# Patient Record
Sex: Male | Born: 1970 | Race: White | Hispanic: No | Marital: Married | Smoking: Former smoker
Health system: Southern US, Community
[De-identification: ages and names within clinical notes are randomized; demographics above are authoritative.]

## PROBLEM LIST (undated history)

## (undated) DIAGNOSIS — J3089 Other allergic rhinitis: Secondary | ICD-10-CM

## (undated) DIAGNOSIS — J302 Other seasonal allergic rhinitis: Secondary | ICD-10-CM

## (undated) HISTORY — DX: Other seasonal allergic rhinitis: J30.2

## (undated) HISTORY — PX: APPENDECTOMY: SHX54

## (undated) HISTORY — DX: Other seasonal allergic rhinitis: J30.89

## (undated) HISTORY — DX: Gilbert syndrome: E80.4

## (undated) HISTORY — PX: WISDOM TOOTH EXTRACTION: SHX21

---

## 2006-08-25 ENCOUNTER — Ambulatory Visit: Payer: Self-pay | Admitting: Internal Medicine

## 2006-09-01 ENCOUNTER — Ambulatory Visit: Payer: Self-pay | Admitting: Internal Medicine

## 2006-09-03 ENCOUNTER — Ambulatory Visit: Payer: Self-pay | Admitting: Internal Medicine

## 2008-02-27 ENCOUNTER — Ambulatory Visit: Payer: Self-pay | Admitting: Internal Medicine

## 2008-02-27 DIAGNOSIS — J302 Other seasonal allergic rhinitis: Secondary | ICD-10-CM

## 2008-03-02 ENCOUNTER — Ambulatory Visit: Payer: Self-pay | Admitting: Internal Medicine

## 2008-03-02 ENCOUNTER — Encounter (INDEPENDENT_AMBULATORY_CARE_PROVIDER_SITE_OTHER): Payer: Self-pay | Admitting: *Deleted

## 2008-03-02 LAB — CONVERTED CEMR LAB
OCCULT 2: NEGATIVE
OCCULT 3: NEGATIVE

## 2008-03-06 ENCOUNTER — Encounter (INDEPENDENT_AMBULATORY_CARE_PROVIDER_SITE_OTHER): Payer: Self-pay | Admitting: *Deleted

## 2011-08-05 ENCOUNTER — Emergency Department (HOSPITAL_COMMUNITY)
Admission: EM | Admit: 2011-08-05 | Discharge: 2011-08-05 | Disposition: A | Discharge status: Home / Self Care | Payer: Managed Care, Other (non HMO) | Attending: Emergency Medicine | Admitting: Emergency Medicine

## 2011-08-05 DIAGNOSIS — Z203 Contact with and (suspected) exposure to rabies: Secondary | ICD-10-CM | POA: Insufficient documentation

## 2011-08-12 ENCOUNTER — Inpatient Hospital Stay (INDEPENDENT_AMBULATORY_CARE_PROVIDER_SITE_OTHER)
Admission: RE | Admit: 2011-08-12 | Discharge: 2011-08-12 | Disposition: A | Discharge status: Home / Self Care | Payer: Managed Care, Other (non HMO) | Source: Ambulatory Visit | Attending: Emergency Medicine | Admitting: Emergency Medicine

## 2011-08-12 DIAGNOSIS — Z23 Encounter for immunization: Secondary | ICD-10-CM

## 2011-08-19 ENCOUNTER — Inpatient Hospital Stay (INDEPENDENT_AMBULATORY_CARE_PROVIDER_SITE_OTHER)
Admission: RE | Admit: 2011-08-19 | Discharge: 2011-08-19 | Disposition: A | Discharge status: Home / Self Care | Payer: Managed Care, Other (non HMO) | Source: Ambulatory Visit | Attending: Emergency Medicine | Admitting: Emergency Medicine

## 2011-08-19 DIAGNOSIS — Z23 Encounter for immunization: Secondary | ICD-10-CM

## 2013-01-02 ENCOUNTER — Encounter: Payer: Self-pay | Admitting: Internal Medicine

## 2013-01-02 ENCOUNTER — Ambulatory Visit (INDEPENDENT_AMBULATORY_CARE_PROVIDER_SITE_OTHER): Payer: Managed Care, Other (non HMO) | Admitting: Internal Medicine

## 2013-01-02 ENCOUNTER — Telehealth: Payer: Self-pay | Admitting: Internal Medicine

## 2013-01-02 VITALS — BP 132/84 | HR 98 | Temp 98.2°F | Resp 12 | Ht 70.5 in | Wt 214.0 lb

## 2013-01-02 DIAGNOSIS — E782 Mixed hyperlipidemia: Secondary | ICD-10-CM | POA: Insufficient documentation

## 2013-01-02 DIAGNOSIS — Z20828 Contact with and (suspected) exposure to other viral communicable diseases: Secondary | ICD-10-CM

## 2013-01-02 DIAGNOSIS — Z0000 Encounter for general adult medical examination without abnormal findings: Secondary | ICD-10-CM

## 2013-01-02 DIAGNOSIS — E785 Hyperlipidemia, unspecified: Secondary | ICD-10-CM | POA: Insufficient documentation

## 2013-01-02 MED ORDER — OSELTAMIVIR PHOSPHATE 75 MG PO CAPS: 75.0000 mg | ORAL_CAPSULE | Freq: Two times a day (BID) | ORAL | Status: DC

## 2013-01-02 NOTE — Patient Instructions (Addendum)
Preventive Health Care: Exercise at least 30-45 minutes a day,  3-4 days a week.  Eat a low-fat diet with lots of fruits and vegetables, up to 7-9 servings per day.  Consume less than 40 grams of sugar per day from foods & drinks with High Fructose Corn Sugar as #1,2,3 or # 4 on label. Please  schedule fasting Labs : BMET,Lipids, hepatic panel, CBC & dif, TSH. PLEASE BRING THESE INSTRUCTIONS TO FOLLOW UP  LAB APPOINTMENT.This will guarantee correct labs are drawn, eliminating need for repeat blood sampling ( needle sticks ! ). Diagnoses /Codes: V70.0.  If you activate My Chart; the results can be released to you as soon as they populate from the lab. If you choose not to use this program; the labs have to be reviewed, copied & mailed   causing a delay in getting the results to you.   NSAIDS ( Aleve, Advil, Naproxen) or Tylenol every 4 hrs as needed for fever as discussed based on label recommendations. Zicam Melts or Zinc lozenges ; vitamin C 2000 mg daily; & Echinacea for 4-7 days. Report fever, exudate("pus") or progressive forehead or facial pain.

## 2013-01-02 NOTE — Progress Notes (Signed)
  Subjective:    Patient ID: Travis Forbes, male    DOB: 07/24/1971, 42 y.o.   MRN: [id number]  HPI  Travis Forbes is here for a physical;acute issues include flu like syndrome.      Review of Systems He's been traveling as part of his job recently and was exposed to influenza. He describes a cough with some green sputum as well as arthralgias and myalgias. DayQuil was of some benefit.  He's had associated low-grade fever. He denies frontal headache, facial pain, or nasal purulence. He has not taken flu shot.    Objective:   Physical Exam Gen.: Healthy and well-nourished in appearance. Alert, appropriate and cooperative throughout exam.  Head: Normocephalic without obvious abnormalities Eyes: No corneal or conjunctival inflammation noted. Pupils equal round reactive to light and accommodation. Fundal exam is benign without hemorrhages, exudate, papilledema. Extraocular motion intact. Vision grossly normal. Ears: External  ear exam reveals no significant lesions or deformities. Canals clear .TMs normal. Hearing is grossly normal bilaterally. Nose: External nasal exam reveals no deformity or inflammation. Nasal mucosa are pink and moist. No lesions or exudates noted. Septum  To R Mouth: Oral mucosa and oropharynx reveal no lesions or exudates. Teeth in good repair.Hoarse Neck: No deformities, masses, or tenderness noted. Range of motion &Thyroid normal. Lungs: Normal respiratory effort; chest expands symmetrically. Lungs are clear to auscultation without rales, wheezes, or increased work of breathing. Heart: Normal rate and rhythm. Normal S1 and S2. No gallop, click, or rub. S4 w/o murmur. Abdomen: Bowel sounds normal; abdomen soft and nontender. No masses, organomegaly or hernias noted. 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 Prostate is normal without enlargement, asymmetry, nodularity, or induration.                                    Musculoskeletal/extremities: No deformity or  scoliosis noted of  the thoracic or lumbar spine. No clubbing, cyanosis, edema, or significant extremity  deformity noted. Range of motion normal .Tone & strength  normal.Joints normal. Nail health good. Able to lie down & sit up w/o help. Negative SLR bilaterally Vascular: Carotid, radial artery, dorsalis pedis and  posterior tibial pulses are full and equal. No bruits present. Neurologic: Alert and oriented x3. Deep tendon reflexes symmetrical and normal.  Skin: Intact without suspicious lesions or rashes. Lymph: No cervical, axillary, or inguinal lymphadenopathy present. Psych: Mood and affect are normal. Normally interactive                                                                                         Assessment & Plan:  #1 comprehensive physical exam; no acute findings #2 see Problem List with Assessments & Recommendations Plan: see Orders

## 2013-01-02 NOTE — Telephone Encounter (Signed)
Patient would like to have labs done at [facility] office.

## 2013-01-03 NOTE — Telephone Encounter (Signed)
Orders placed.

## 2014-04-03 ENCOUNTER — Encounter: Payer: Self-pay | Admitting: Internal Medicine

## 2014-04-03 ENCOUNTER — Telehealth: Payer: Self-pay

## 2014-04-03 ENCOUNTER — Other Ambulatory Visit (INDEPENDENT_AMBULATORY_CARE_PROVIDER_SITE_OTHER): Payer: Managed Care, Other (non HMO)

## 2014-04-03 ENCOUNTER — Ambulatory Visit (INDEPENDENT_AMBULATORY_CARE_PROVIDER_SITE_OTHER): Payer: Managed Care, Other (non HMO) | Admitting: Internal Medicine

## 2014-04-03 VITALS — BP 130/90 | HR 66 | Temp 97.7°F | Resp 12 | Ht 69.25 in | Wt 212.0 lb

## 2014-04-03 DIAGNOSIS — Z0000 Encounter for general adult medical examination without abnormal findings: Secondary | ICD-10-CM

## 2014-04-03 DIAGNOSIS — Z3009 Encounter for other general counseling and advice on contraception: Secondary | ICD-10-CM

## 2014-04-03 DIAGNOSIS — E785 Hyperlipidemia, unspecified: Secondary | ICD-10-CM

## 2014-04-03 LAB — BASIC METABOLIC PANEL
BUN: 10 mg/dL (ref 6–23)
CALCIUM: 9.4 mg/dL (ref 8.4–10.5)
CO2: 29 mEq/L (ref 19–32)
Chloride: 105 mEq/L (ref 96–112)
Creatinine, Ser: 0.9 mg/dL (ref 0.4–1.5)
GFR: 102.32 mL/min (ref >60.00)
Glucose, Bld: 91 mg/dL (ref 70–99)
Potassium: 4.3 mEq/L (ref 3.5–5.1)
SODIUM: 140 meq/L (ref 135–145)

## 2014-04-03 LAB — HEPATIC FUNCTION PANEL
ALBUMIN: 4.3 g/dL (ref 3.5–5.2)
ALK PHOS: 46 U/L (ref 39–117)
ALT: 32 U/L (ref 0–53)
AST: 23 U/L (ref 0–37)
Bilirubin, Direct: 0.2 mg/dL (ref 0.0–0.3)
TOTAL PROTEIN: 7.1 g/dL (ref 6.0–8.3)
Total Bilirubin: 1.5 mg/dL — ABNORMAL HIGH (ref 0.3–1.2)

## 2014-04-03 LAB — LIPID PANEL
Cholesterol: 181 mg/dL (ref 0–200)
HDL: 48.7 mg/dL (ref >39.00)
LDL CALC: 114 mg/dL — AB (ref 0–99)
TRIGLYCERIDES: 92 mg/dL (ref 0.0–149.0)
Total CHOL/HDL Ratio: 4
VLDL: 18.4 mg/dL (ref 0.0–40.0)

## 2014-04-03 LAB — CBC WITH DIFFERENTIAL/PLATELET
BASOS ABS: 0 10*3/uL (ref 0.0–0.1)
BASOS PCT: 0.6 % (ref 0.0–3.0)
Eosinophils Absolute: 0.2 10*3/uL (ref 0.0–0.7)
Eosinophils Relative: 2.8 % (ref 0.0–5.0)
HCT: 42.5 % (ref 39.0–52.0)
HEMOGLOBIN: 14.8 g/dL (ref 13.0–17.0)
LYMPHS PCT: 34.5 % (ref 12.0–46.0)
Lymphs Abs: 2.1 10*3/uL (ref 0.7–4.0)
MCHC: 34.7 g/dL (ref 30.0–36.0)
MCV: 90.3 fl (ref 78.0–100.0)
MONOS PCT: 7.6 % (ref 3.0–12.0)
Monocytes Absolute: 0.5 10*3/uL (ref 0.1–1.0)
NEUTROS ABS: 3.3 10*3/uL (ref 1.4–7.7)
Neutrophils Relative %: 54.5 % (ref 43.0–77.0)
Platelets: 221 10*3/uL (ref 150.0–400.0)
RBC: 4.7 Mil/uL (ref 4.22–5.81)
RDW: 12.4 % (ref 11.5–14.6)
WBC: 6.1 10*3/uL (ref 4.5–10.5)

## 2014-04-03 LAB — TSH: TSH: 1.51 u[IU]/mL (ref 0.35–5.50)

## 2014-04-03 NOTE — Progress Notes (Signed)
Pre visit review using our clinic review tool, if applicable. No additional management support is needed unless otherwise documented below in the visit note. 

## 2014-04-03 NOTE — Telephone Encounter (Signed)
Unfortunately I did know this;  these were completed @ 10:19 am . If you will sign up for MyChart ; results will be available this evening

## 2014-04-03 NOTE — Patient Instructions (Signed)

## 2014-04-03 NOTE — Progress Notes (Signed)
   Subjective:    Patient ID: Travis Forbes, male    DOB: 10-28-1971, 43 y.o.   MRN: [id number]  HPI  He is here for a physical;acute issues denied . No PMH of HTN. He is considering vasectomy.  A heart healthy diet is followed; exercise encompasses 30-90  Minutes 3-4 times per week as treadmill & walking without symptoms.  Family history is neg for premature coronary disease. Advanced cholesterol testing reveals  LDL goal is less than 110 ; ideally < 80 . To date no statin.  Low dose ASA not taken     Review of Systems Specifically denied are  chest pain, palpitations, dyspnea, or claudication.  Significant abdominal symptoms or myalgias not present.       Objective:   Physical Exam Gen.: Healthy and well-nourished in appearance. Alert, appropriate and cooperative throughout exam. Appears younger than stated age  Head: Normocephalic without obvious abnormalities  Eyes: No corneal or conjunctival inflammation noted. Pupils equal round reactive to light and accommodation. Extraocular motion intact.  Vision grossly normal w/o lenses Ears: External  ear exam reveals no significant lesions or deformities. Canals clear .TMs normal. Hearing is grossly normal bilaterally. Nose: External nasal exam reveals no deformity or inflammation. Nasal mucosa are pink and moist. No lesions or exudates noted.   Mouth: Oral mucosa and oropharynx reveal no lesions or exudates. Teeth in good repair. Neck: No deformities, masses, or tenderness noted. Range of motion &Thyroid normal. Lungs: Normal respiratory effort; chest expands symmetrically. Lungs are clear to auscultation without rales, wheezes, or increased work of breathing. Heart: Normal rate and rhythm. Normal S1 and S2. No gallop, click, or rub. S4 w/o murmur. Abdomen: Bowel sounds normal; abdomen soft and nontender. No masses, organomegaly or hernias noted. Genitalia: Genitalia normal except for left varices. Prostate is normal without enlargement,  asymmetry, nodularity, or induration                                Musculoskeletal/extremities: No deformity or scoliosis noted of  the thoracic or lumbar spine.  No clubbing, cyanosis, edema, or significant extremity  deformity noted. Range of motion normal .Tone & strength normal. Hand joints normal Fingernail  health good. Able to lie down & sit up w/o help. Negative SLR bilaterally Vascular: Carotid, radial artery, dorsalis pedis and  posterior tibial pulses are full and equal. No bruits present. Neurologic: Alert and oriented x3. Deep tendon reflexes symmetrical and normal.  Gait normal . Skin: Intact without suspicious lesions or rashes. Lymph: No cervical, axillary, or inguinal lymphadenopathy present. Psych: Mood and affect are normal. Normally interactive                                                                                        Assessment & Plan:  #1 comprehensive physical exam; no acute findings #2 high normal diastolic BP  Plan: see Orders  & Recommendations

## 2014-04-03 NOTE — Telephone Encounter (Signed)
Patient would like to have labs done at [facility]. So will you write the orders out on an rx sheet so I can fax it to [facility]. Thanks

## 2019-07-06 ENCOUNTER — Other Ambulatory Visit: Payer: Self-pay | Admitting: Internal Medicine

## 2019-07-06 DIAGNOSIS — Z20822 Contact with and (suspected) exposure to covid-19: Secondary | ICD-10-CM

## 2019-07-06 NOTE — Progress Notes (Signed)
no

## 2019-07-09 LAB — NOVEL CORONAVIRUS, NAA: SARS-CoV-2, NAA: NOT DETECTED

## 2019-10-02 ENCOUNTER — Other Ambulatory Visit: Payer: Self-pay

## 2019-10-02 DIAGNOSIS — Z20822 Contact with and (suspected) exposure to covid-19: Secondary | ICD-10-CM

## 2019-10-04 LAB — NOVEL CORONAVIRUS, NAA: SARS-CoV-2, NAA: NOT DETECTED

## 2021-01-31 ENCOUNTER — Ambulatory Visit (HOSPITAL_COMMUNITY)
Admission: EM | Admit: 2021-01-31 | Discharge: 2021-01-31 | Disposition: A | Discharge status: Home / Self Care | Payer: Managed Care, Other (non HMO) | Source: Home / Self Care

## 2021-01-31 ENCOUNTER — Ambulatory Visit (INDEPENDENT_AMBULATORY_CARE_PROVIDER_SITE_OTHER): Payer: Managed Care, Other (non HMO)

## 2021-01-31 ENCOUNTER — Other Ambulatory Visit: Payer: Self-pay

## 2021-01-31 ENCOUNTER — Encounter (HOSPITAL_COMMUNITY): Payer: Self-pay

## 2021-01-31 DIAGNOSIS — R1031 Right lower quadrant pain: Secondary | ICD-10-CM

## 2021-01-31 DIAGNOSIS — K651 Peritoneal abscess: Secondary | ICD-10-CM | POA: Diagnosis not present

## 2021-01-31 DIAGNOSIS — K3533 Acute appendicitis with perforation and localized peritonitis, with abscess: Secondary | ICD-10-CM | POA: Diagnosis not present

## 2021-01-31 DIAGNOSIS — R1030 Lower abdominal pain, unspecified: Secondary | ICD-10-CM | POA: Diagnosis not present

## 2021-01-31 DIAGNOSIS — R1032 Left lower quadrant pain: Secondary | ICD-10-CM

## 2021-01-31 DIAGNOSIS — R5383 Other fatigue: Secondary | ICD-10-CM | POA: Insufficient documentation

## 2021-01-31 DIAGNOSIS — K5900 Constipation, unspecified: Secondary | ICD-10-CM | POA: Diagnosis not present

## 2021-01-31 DIAGNOSIS — R21 Rash and other nonspecific skin eruption: Secondary | ICD-10-CM

## 2021-01-31 LAB — CBC WITH DIFFERENTIAL/PLATELET
Abs Immature Granulocytes: 0.3 10*3/uL — ABNORMAL HIGH (ref 0.00–0.07)
Basophils Absolute: 0 10*3/uL (ref 0.0–0.1)
Basophils Relative: 0 %
Eosinophils Absolute: 0 10*3/uL (ref 0.0–0.5)
Eosinophils Relative: 0 %
HCT: 40.2 % (ref 39.0–52.0)
Hemoglobin: 13.7 g/dL (ref 13.0–17.0)
Immature Granulocytes: 1 %
Lymphocytes Relative: 5 %
Lymphs Abs: 1.1 10*3/uL (ref 0.7–4.0)
MCH: 30.6 pg (ref 26.0–34.0)
MCHC: 34.1 g/dL (ref 30.0–36.0)
MCV: 89.9 fL (ref 80.0–100.0)
Monocytes Absolute: 1.8 10*3/uL — ABNORMAL HIGH (ref 0.1–1.0)
Monocytes Relative: 8 %
Neutro Abs: 19.1 10*3/uL — ABNORMAL HIGH (ref 1.7–7.7)
Neutrophils Relative %: 86 %
Platelets: 394 10*3/uL (ref 150–400)
RBC: 4.47 MIL/uL (ref 4.22–5.81)
RDW: 11.3 % — ABNORMAL LOW (ref 11.5–15.5)
WBC: 22.3 10*3/uL — ABNORMAL HIGH (ref 4.0–10.5)
nRBC: 0 % (ref 0.0–0.2)

## 2021-01-31 LAB — COMPREHENSIVE METABOLIC PANEL
ALT: 42 U/L (ref 0–44)
AST: 25 U/L (ref 15–41)
Albumin: 3.1 g/dL — ABNORMAL LOW (ref 3.5–5.0)
Alkaline Phosphatase: 73 U/L (ref 38–126)
Anion gap: 11 (ref 5–15)
BUN: 6 mg/dL (ref 6–20)
CO2: 24 mmol/L (ref 22–32)
Calcium: 9 mg/dL (ref 8.9–10.3)
Chloride: 98 mmol/L (ref 98–111)
Creatinine, Ser: 0.88 mg/dL (ref 0.61–1.24)
GFR, Estimated: >60 mL/min (ref >60)
Glucose, Bld: 108 mg/dL — ABNORMAL HIGH (ref 70–99)
Potassium: 3.8 mmol/L (ref 3.5–5.1)
Sodium: 133 mmol/L — ABNORMAL LOW (ref 135–145)
Total Bilirubin: 1.3 mg/dL — ABNORMAL HIGH (ref 0.3–1.2)
Total Protein: 7.2 g/dL (ref 6.5–8.1)

## 2021-01-31 LAB — LIPASE, BLOOD: Lipase: 30 U/L (ref 11–51)

## 2021-01-31 MED ORDER — DICYCLOMINE HCL 20 MG PO TABS: 20.0000 mg | ORAL_TABLET | Freq: Two times a day (BID) | ORAL | 0 refills | Status: DC | PRN

## 2021-01-31 NOTE — ED Triage Notes (Signed)
Pt presents with lower abdominal pain and nausea with some fatigue for about a week.

## 2021-01-31 NOTE — ED Provider Notes (Signed)
[place]    CSN: [id number] Arrival date & time: 01/31/21  1914      History   Chief Complaint Chief Complaint  Patient presents with  . Abdominal Pain    HPI Travis Forbes is a 50 y.o. male.   50 year old male presents with lower abdominal pain that started over 1 week ago. He was flying from [place] to [place] when he started having lower abdominal pain and nausea. He made it home but then vomited. He also felt constipated so he drank some prune juice and took a few laxatives which caused loose stools. He started to feel better but then pain started getting worse again a few days ago. Pain is now sharp and feels like a "pulling sensation". He occasionally will feel nauseous but concerned over continued pain. No recent vomiting. No known fever. Has felt very fatigued but having difficulty getting in a comfortable position to sleep. No urinary symptoms. Also slight rash on upper chest- he feels this is due to sweating earlier in the week. Rash is improving. He had gone to [facility] and they felt the pain was still due to constipation and gave him more laxatives. No known exposure to COVID 19. Took home COVID 19 test which was negative. [facility] also performed another COVID 19 test which also was negative. Has tried applying heat to area which made pain worse. Has been trying to drink more water and Pedialyte. Did note that Coca Cola seems to help with his symptoms. No recent alcohol intake. Planning to fly to [place] for work on Monday (3 days) and needs to find out the cause of his symptoms before leaving. 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 Not currently on any medication.    The history is provided by the patient.    Past Medical History:  Diagnosis Date  . Gilbert syndrome   . Perennial allergic rhinitis with seasonal  variation     Patient Active Problem List   Diagnosis Date Noted  . Other and unspecified hyperlipidemia 01/02/2013  . GILBERT'S SYNDROME 02/27/2008  . Seasonal rhinitis 02/27/2008    Past Surgical History:  Procedure Laterality Date  . WISDOM TOOTH EXTRACTION         Home Medications    Prior to Admission medications   Medication Sig Start Date End Date Taking? Authorizing Provider  dicyclomine (BENTYL) 20 MG tablet Take 1 tablet (20 mg total) by mouth 2 (two) times daily as needed for spasms (and abdominal pain). 01/31/21  Yes Travonne Schowalter, Nicholes Stairs, NP    Family History Family History  Problem Relation Age of Onset  . Breast cancer Mother   . Heart attack Maternal Grandfather        late 53s  . Diabetes Maternal Grandfather   . Hypertension Paternal Grandmother   . Stroke Neg Hx     Social History Social History   Tobacco Use  . Smoking status: Former Smoker    Quit date: 12/14/1996    Years since quitting: 24.1  . Tobacco comment: smoked 1989-1998; up to 1 pack per week  Substance Use Topics  . Alcohol use: Yes    Alcohol/week: 14.0 standard drinks    Types: 14 Cans of beer per week    Comment: Socially  . Drug use: No     Allergies   Patient has no known allergies.   Review of  Systems Review of Systems  Constitutional: Positive for appetite change and fatigue. Negative for chills and fever.  HENT: Negative for congestion, sore throat and trouble swallowing.   Respiratory: Negative for cough, chest tightness, shortness of breath and wheezing.   Cardiovascular: Negative for chest pain.  Gastrointestinal: Positive for abdominal pain, constipation, nausea and vomiting. Negative for blood in stool.  Genitourinary: Negative for difficulty urinating, dysuria, flank pain and hematuria.  Musculoskeletal: Negative for arthralgias and myalgias.  Skin: Positive for rash. Negative for wound.  Allergic/Immunologic: Positive for environmental allergies. Negative for  food allergies and immunocompromised state.  Neurological: Negative for dizziness, tremors, seizures, syncope, facial asymmetry, speech difficulty, weakness, light-headedness and numbness.  Hematological: Negative for adenopathy. Does not bruise/bleed easily.  Psychiatric/Behavioral: Positive for sleep disturbance.     Physical Exam Triage Vital Signs ED Triage Vitals  Enc Vitals Group     BP 01/31/21 1028 119/90     Pulse Rate 01/31/21 1028 (!) 106     Resp 01/31/21 1028 17     Temp 01/31/21 1028 99.2 F (37.3 C)     Temp Source 01/31/21 1028 Oral     SpO2 01/31/21 1028 100 %     Weight --      Height --      Head Circumference --      Peak Flow --      Pain Score 01/31/21 1027 6     Pain Loc --      Pain Edu? --      Excl. in [place]? --    No data found.  Updated Vital Signs BP 119/90 (BP Location: Right Arm)   Pulse (!) 106   Temp 99.2 F (37.3 C) (Oral)   Resp 17   SpO2 100%   Visual Acuity Right Eye Distance:   Left Eye Distance:   Bilateral Distance:    Right Eye Near:   Left Eye Near:    Bilateral Near:     Physical Exam Vitals and nursing note reviewed.  Constitutional:      General: He is awake. He is not in acute distress.    Appearance: He is well-developed and well-groomed.     Comments: He is sitting in the exam chair in no acute distress but appears uncomfortable and is leaning back against the wall to help with pain.   HENT:     Head: Normocephalic and atraumatic.     Right Ear: Hearing and external ear normal.     Left Ear: Hearing and external ear normal.     Nose: Nose normal.     Mouth/Throat:     Lips: Pink.     Mouth: Mucous membranes are moist. No oral lesions.     Pharynx: Oropharynx is clear. Uvula midline. No pharyngeal swelling or posterior oropharyngeal erythema.  Eyes:     General: No scleral icterus.    Extraocular Movements: Extraocular movements intact.     Conjunctiva/sclera: Conjunctivae normal.  Cardiovascular:     Rate  and Rhythm: Regular rhythm. Tachycardia present.     Heart sounds: Normal heart sounds. No murmur heard.   Pulmonary:     Effort: Pulmonary effort is normal. No respiratory distress.     Breath sounds: Normal breath sounds and air entry. No decreased air movement. No decreased breath sounds, wheezing, rhonchi or rales.  Abdominal:     General: Bowel sounds are normal. There is distension.     Palpations: Abdomen is soft.     Tenderness:  There is abdominal tenderness in the right upper quadrant, right lower quadrant, epigastric area and left lower quadrant. There is no right CVA tenderness, left CVA tenderness, guarding or rebound.     Hernia: There is no hernia in the left inguinal area or right inguinal area.       Comments: Tender especially over right mid to lower abdominal region. Muscles on right side more tight but not rigid abdomen. Entire abdomen slightly distended.   Musculoskeletal:        General: Normal range of motion.  Skin:    General: Skin is warm and dry.     Capillary Refill: Capillary refill takes less than 2 seconds.     Coloration: Skin is not jaundiced.     Findings: Rash present. Rash is papular. Rash is not crusting, nodular, purpuric, pustular, scaling or vesicular.          Comments: Pink to red raised small lesions present on upper chest. No crusting or discharge. No tenderness. Patient indicates rash is improving.   Neurological:     General: No focal deficit present.     Mental Status: He is alert and oriented to person, place, and time.     Sensory: Sensation is intact. No sensory deficit.     Motor: Motor function is intact.  Psychiatric:        Mood and Affect: Mood normal.        Behavior: Behavior normal. Behavior is cooperative.        Thought Content: Thought content normal.        Judgment: Judgment normal.      UC Treatments / Results  Labs (all labs ordered are listed, but only abnormal results are displayed) Labs Reviewed  CBC WITH  DIFFERENTIAL/PLATELET - Abnormal; Notable for the following components:      Result Value   WBC 22.3 (*)    RDW 11.3 (*)    Neutro Abs 19.1 (*)    Monocytes Absolute 1.8 (*)    Abs Immature Granulocytes 0.30 (*)    All other components within normal limits  COMPREHENSIVE METABOLIC PANEL - Abnormal; Notable for the following components:   Sodium 133 (*)    Glucose, Bld 108 (*)    Albumin 3.1 (*)    Total Bilirubin 1.3 (*)    All other components within normal limits  LIPASE, BLOOD    EKG   Radiology DG Abd 2 Views  Result Date: 01/31/2021 CLINICAL DATA:  Low abdominal pain for 1 week. EXAM: ABDOMEN - 2 VIEW COMPARISON:  None FINDINGS: The bowel gas pattern is normal. There is no evidence of free air. No radio-opaque calculi or other significant radiographic abnormality is seen. IMPRESSION: Negative. Electronically Signed   By: Kerby Moors M.D.   On: 01/31/2021 11:52    Procedures Procedures (including critical care time)  Medications Ordered in UC Medications - No data to display  Initial Impression / Assessment and Plan / UC Course  I have reviewed the triage vital signs and the nursing notes.  Pertinent labs & imaging results that were available during my care of the patient were reviewed by me and considered in my medical decision making (see chart for details).     Reviewed abdominal x-ray results with patient. No signs of perforated bowel. No distinct abnormalities on plain film. Obtained CBC, Chemistry panel and Lipase level. Discussed with patient many potential causes of his pain including possible hernia, Diverticulitis, gallbladder disease, pancreatitis, and viral gastritis among other  causes. Can not completely rule out appendicitis. Discussed that he really needs further evaluation and imaging with probable CT scan- reviewed that his PCP could order CT but will probably not be done before Monday unless he goes to the ER. He can try Bentyl 20mg  every 12 hours as  needed for abdominal pain/spasms. Continue to push fluids and water. May continue prune juice to help keep bowel movements regular. Since rash is improving but occasionally itchy, can use OTC hydrocortisone cream as needed. Continue to monitor symptoms. If pain worsens or he is unable to keep fluids down, he will need to go to the ER ASAP. Otherwise, follow-up pending lab results.  Final Clinical Impressions(s) / UC Diagnoses   Final diagnoses:  Bilateral lower abdominal pain  Constipation, unspecified constipation type  Fatigue, unspecified type  Rash and nonspecific skin eruption     Discharge Instructions     Recommend start Bentyl 20mg  every 12 hours as needed for abdominal pain/spasms. Continue to push water and fluids. May continue prune juice to help keep bowel movements regular. Continue to monitor symptoms. If pain worsens or unable to keep fluid or food down, go to the ER ASAP. Otherwise, follow-up pending lab results.     ED Prescriptions    Medication Sig Dispense Auth. Provider   dicyclomine (BENTYL) 20 MG tablet Take 1 tablet (20 mg total) by mouth 2 (two) times daily as needed for spasms (and abdominal pain). 20 tablet Kaisyn Millea, Nicholes Stairs, NP     PDMP not reviewed this encounter.   Katy Apo, NP 01/31/21 2027

## 2021-01-31 NOTE — Discharge Instructions (Addendum)
Recommend start Bentyl 20mg  every 12 hours as needed for abdominal pain/spasms. Continue to push water and fluids. May continue prune juice to help keep bowel movements regular. Continue to monitor symptoms. If pain worsens or unable to keep fluid or food down, go to the ER ASAP. Otherwise, follow-up pending lab results.

## 2021-02-01 ENCOUNTER — Emergency Department (HOSPITAL_COMMUNITY): Payer: Managed Care, Other (non HMO)

## 2021-02-01 ENCOUNTER — Inpatient Hospital Stay (HOSPITAL_COMMUNITY)
Admission: EM | Admit: 2021-02-01 | Discharge: 2021-02-04 | DRG: 373 | Disposition: A | Discharge status: Home / Self Care | Payer: Managed Care, Other (non HMO) | Attending: General Surgery | Admitting: General Surgery

## 2021-02-01 ENCOUNTER — Other Ambulatory Visit: Payer: Self-pay

## 2021-02-01 ENCOUNTER — Encounter (HOSPITAL_COMMUNITY): Payer: Self-pay | Admitting: Emergency Medicine

## 2021-02-01 DIAGNOSIS — K429 Umbilical hernia without obstruction or gangrene: Secondary | ICD-10-CM | POA: Diagnosis present

## 2021-02-01 DIAGNOSIS — Z87891 Personal history of nicotine dependence: Secondary | ICD-10-CM

## 2021-02-01 DIAGNOSIS — K3533 Acute appendicitis with perforation and localized peritonitis, with abscess: Secondary | ICD-10-CM | POA: Diagnosis present

## 2021-02-01 DIAGNOSIS — R21 Rash and other nonspecific skin eruption: Secondary | ICD-10-CM | POA: Diagnosis present

## 2021-02-01 DIAGNOSIS — B9620 Unspecified Escherichia coli [E. coli] as the cause of diseases classified elsewhere: Secondary | ICD-10-CM | POA: Diagnosis present

## 2021-02-01 DIAGNOSIS — Z6830 Body mass index (BMI) 30.0-30.9, adult: Secondary | ICD-10-CM | POA: Diagnosis not present

## 2021-02-01 DIAGNOSIS — K5730 Diverticulosis of large intestine without perforation or abscess without bleeding: Secondary | ICD-10-CM | POA: Diagnosis present

## 2021-02-01 DIAGNOSIS — E669 Obesity, unspecified: Secondary | ICD-10-CM

## 2021-02-01 DIAGNOSIS — Z79899 Other long term (current) drug therapy: Secondary | ICD-10-CM

## 2021-02-01 DIAGNOSIS — Z20822 Contact with and (suspected) exposure to covid-19: Secondary | ICD-10-CM | POA: Diagnosis present

## 2021-02-01 DIAGNOSIS — J302 Other seasonal allergic rhinitis: Secondary | ICD-10-CM | POA: Diagnosis present

## 2021-02-01 DIAGNOSIS — K3521 Acute appendicitis with generalized peritonitis, with abscess: Secondary | ICD-10-CM

## 2021-02-01 DIAGNOSIS — K651 Peritoneal abscess: Secondary | ICD-10-CM | POA: Diagnosis present

## 2021-02-01 DIAGNOSIS — E785 Hyperlipidemia, unspecified: Secondary | ICD-10-CM | POA: Diagnosis present

## 2021-02-01 DIAGNOSIS — K35211 Acute appendicitis with generalized peritonitis, with perforation and abscess: Secondary | ICD-10-CM

## 2021-02-01 DIAGNOSIS — K3532 Acute appendicitis with perforation and localized peritonitis, without abscess: Secondary | ICD-10-CM | POA: Diagnosis present

## 2021-02-01 LAB — CBC WITH DIFFERENTIAL/PLATELET
Abs Immature Granulocytes: 0.36 10*3/uL — ABNORMAL HIGH (ref 0.00–0.07)
Basophils Absolute: 0.1 10*3/uL (ref 0.0–0.1)
Basophils Relative: 0 %
Eosinophils Absolute: 0 10*3/uL (ref 0.0–0.5)
Eosinophils Relative: 0 %
HCT: 41.5 % (ref 39.0–52.0)
Hemoglobin: 14.4 g/dL (ref 13.0–17.0)
Immature Granulocytes: 2 %
Lymphocytes Relative: 6 %
Lymphs Abs: 1.4 10*3/uL (ref 0.7–4.0)
MCH: 31.1 pg (ref 26.0–34.0)
MCHC: 34.7 g/dL (ref 30.0–36.0)
MCV: 89.6 fL (ref 80.0–100.0)
Monocytes Absolute: 1.9 10*3/uL — ABNORMAL HIGH (ref 0.1–1.0)
Monocytes Relative: 8 %
Neutro Abs: 19.9 10*3/uL — ABNORMAL HIGH (ref 1.7–7.7)
Neutrophils Relative %: 84 %
Platelets: 436 10*3/uL — ABNORMAL HIGH (ref 150–400)
RBC: 4.63 MIL/uL (ref 4.22–5.81)
RDW: 11.6 % (ref 11.5–15.5)
WBC: 23.6 10*3/uL — ABNORMAL HIGH (ref 4.0–10.5)
nRBC: 0 % (ref 0.0–0.2)

## 2021-02-01 LAB — COMPREHENSIVE METABOLIC PANEL
ALT: 49 U/L — ABNORMAL HIGH (ref 0–44)
AST: 33 U/L (ref 15–41)
Albumin: 3.2 g/dL — ABNORMAL LOW (ref 3.5–5.0)
Alkaline Phosphatase: 74 U/L (ref 38–126)
Anion gap: 13 (ref 5–15)
BUN: 7 mg/dL (ref 6–20)
CO2: 23 mmol/L (ref 22–32)
Calcium: 9.1 mg/dL (ref 8.9–10.3)
Chloride: 98 mmol/L (ref 98–111)
Creatinine, Ser: 0.83 mg/dL (ref 0.61–1.24)
GFR, Estimated: >60 mL/min (ref >60)
Glucose, Bld: 108 mg/dL — ABNORMAL HIGH (ref 70–99)
Potassium: 3.8 mmol/L (ref 3.5–5.1)
Sodium: 134 mmol/L — ABNORMAL LOW (ref 135–145)
Total Bilirubin: 1.1 mg/dL (ref 0.3–1.2)
Total Protein: 7.7 g/dL (ref 6.5–8.1)

## 2021-02-01 LAB — URINALYSIS, ROUTINE W REFLEX MICROSCOPIC
Bilirubin Urine: NEGATIVE
Glucose, UA: NEGATIVE mg/dL
Hgb urine dipstick: NEGATIVE
Ketones, ur: NEGATIVE mg/dL
Leukocytes,Ua: NEGATIVE
Nitrite: NEGATIVE
Protein, ur: 30 mg/dL — AB
Specific Gravity, Urine: 1.02 (ref 1.005–1.030)
pH: 6 (ref 5.0–8.0)

## 2021-02-01 LAB — LACTIC ACID, PLASMA: Lactic Acid, Venous: 1.4 mmol/L (ref 0.5–1.9)

## 2021-02-01 LAB — APTT: aPTT: 26 seconds (ref 24–36)

## 2021-02-01 LAB — RESP PANEL BY RT-PCR (FLU A&B, COVID) ARPGX2
Influenza A by PCR: NEGATIVE
Influenza B by PCR: NEGATIVE
SARS Coronavirus 2 by RT PCR: NEGATIVE

## 2021-02-01 LAB — PROTIME-INR
INR: 1.1 (ref 0.8–1.2)
Prothrombin Time: 13.7 seconds (ref 11.4–15.2)

## 2021-02-01 MED ORDER — SIMETHICONE 80 MG PO CHEW: 40.0000 mg | CHEWABLE_TABLET | Freq: Four times a day (QID) | ORAL | Status: DC | PRN

## 2021-02-01 MED ORDER — ENOXAPARIN SODIUM 40 MG/0.4ML ~~LOC~~ SOLN
40.0000 mg | SUBCUTANEOUS | Status: DC
  Administered 2021-02-01 – 2021-02-03 (×2): 40 mg via SUBCUTANEOUS
  Filled 2021-02-01 (×3): qty 0.4

## 2021-02-01 MED ORDER — METRONIDAZOLE IN NACL 5-0.79 MG/ML-% IV SOLN
500.0000 mg | Freq: Once | INTRAVENOUS | Status: AC
  Administered 2021-02-01: 500 mg via INTRAVENOUS
  Filled 2021-02-01: qty 100

## 2021-02-01 MED ORDER — OXYCODONE HCL 5 MG PO TABS: 5.0000 mg | ORAL_TABLET | ORAL | Status: DC | PRN

## 2021-02-01 MED ORDER — SODIUM CHLORIDE 0.9 % IV SOLN
2.0000 g | Freq: Once | INTRAVENOUS | Status: AC
  Administered 2021-02-01: 2 g via INTRAVENOUS
  Filled 2021-02-01: qty 2

## 2021-02-01 MED ORDER — LACTATED RINGERS IV BOLUS (SEPSIS)
1000.0000 mL | Freq: Once | INTRAVENOUS | Status: AC
  Administered 2021-02-01: 1000 mL via INTRAVENOUS

## 2021-02-01 MED ORDER — DEXTROSE-NACL 5-0.45 % IV SOLN: INTRAVENOUS | Status: DC

## 2021-02-01 MED ORDER — METOPROLOL TARTRATE 5 MG/5ML IV SOLN: 5.0000 mg | Freq: Four times a day (QID) | INTRAVENOUS | Status: DC | PRN

## 2021-02-01 MED ORDER — VANCOMYCIN HCL 1750 MG/350ML IV SOLN
1750.0000 mg | Freq: Once | INTRAVENOUS | Status: AC
  Administered 2021-02-01: 1750 mg via INTRAVENOUS
  Filled 2021-02-01: qty 350

## 2021-02-01 MED ORDER — VANCOMYCIN HCL 750 MG/150ML IV SOLN
750.0000 mg | Freq: Three times a day (TID) | INTRAVENOUS | Status: DC
  Administered 2021-02-01 – 2021-02-04 (×8): 750 mg via INTRAVENOUS
  Filled 2021-02-01 (×11): qty 150

## 2021-02-01 MED ORDER — MORPHINE SULFATE (PF) 2 MG/ML IV SOLN: 2.0000 mg | INTRAVENOUS | Status: DC | PRN

## 2021-02-01 MED ORDER — ONDANSETRON 4 MG PO TBDP: 4.0000 mg | ORAL_TABLET | Freq: Four times a day (QID) | ORAL | Status: DC | PRN

## 2021-02-01 MED ORDER — LACTATED RINGERS IV SOLN: INTRAVENOUS | Status: DC

## 2021-02-01 MED ORDER — ACETAMINOPHEN 325 MG PO TABS
650.0000 mg | ORAL_TABLET | Freq: Four times a day (QID) | ORAL | Status: DC | PRN
  Administered 2021-02-01 – 2021-02-02 (×2): 650 mg via ORAL
  Filled 2021-02-01 (×3): qty 2

## 2021-02-01 MED ORDER — IOHEXOL 300 MG/ML  SOLN
100.0000 mL | Freq: Once | INTRAMUSCULAR | Status: AC | PRN
  Administered 2021-02-01: 100 mL via INTRAVENOUS

## 2021-02-01 MED ORDER — ONDANSETRON HCL 4 MG/2ML IJ SOLN: 4.0000 mg | Freq: Four times a day (QID) | INTRAMUSCULAR | Status: DC | PRN

## 2021-02-01 MED ORDER — PIPERACILLIN-TAZOBACTAM 3.375 G IVPB
3.3750 g | Freq: Three times a day (TID) | INTRAVENOUS | Status: DC
  Administered 2021-02-01 – 2021-02-04 (×9): 3.375 g via INTRAVENOUS
  Filled 2021-02-01 (×8): qty 50

## 2021-02-01 MED ORDER — ACETAMINOPHEN 650 MG RE SUPP: 650.0000 mg | Freq: Four times a day (QID) | RECTAL | Status: DC | PRN

## 2021-02-01 NOTE — ED Notes (Signed)
Attempted report x1. 

## 2021-02-01 NOTE — ED Notes (Signed)
Attempted report x 2 

## 2021-02-01 NOTE — Sepsis Progress Note (Signed)
elink is following this code sepsis 

## 2021-02-01 NOTE — Progress Notes (Signed)
Pharmacy Antibiotic Note  Travis Forbes is a 50 y.o. male admitted on 02/01/2021 with infection of unknown source.  Pharmacy has been consulted for vancomycin dosing. Also on Zosyn for potential intra abdominal coverage   WBC 23.6, SCr wnl, AF   Plan: -Zosyn per MD  -Vancomycin 1750 mg IV load followed by Vancomycin 750 mg IV Q 8 hrs. Goal AUC 400-550. Expected AUC: 484 SCr used: 0.83 -Monitor cultures, clinical progress -Vanc levels as indicated       Temp (24hrs), Avg:98.3 F (36.8 C), Min:98.3 F (36.8 C), Max:98.3 F (36.8 C)  Recent Labs  Lab 01/31/21 1158  WBC 22.3*  CREATININE 0.88    CrCl cannot be calculated (Unknown ideal weight.).    No Known Allergies  Antimicrobials this admission: Zosyn 2/19 >>  Vanc 2/19 >>   Dose adjustments this admission:   Microbiology results: 2/19 BCx:  2/19 UCx:     Thank you for allowing pharmacy to be a part of this patient's care.  Albertina Parr, PharmD., BCPS, BCCCP Clinical Pharmacist Please refer to [facility] for unit-specific pharmacist

## 2021-02-01 NOTE — ED Provider Notes (Signed)
[facility] EMERGENCY DEPARTMENT Provider Note   CSN: [id number] Arrival date & time: 02/01/21  4034     History Chief Complaint  Patient presents with  . Abdominal Pain    Travis Forbes is a 50 y.o. male.  50 year old male presents with complaint of abdominal pain.  States that he went to [place] earlier this month, felt like he was constipated because he has been on vacation, on the flight home on February 9 he noticed left side abdominal pain with nausea.  Patient ports vomiting upon arrival back home.  Patient treated his constipation at home until he was doing better until his pain returned.  Patient went to [facility] on February 16, was given laxatives, went to urgent care yesterday and told he likely needed a CT scan and to go to the ER if symptoms worsened. Patient was called today and told his WBC was 22 and to go to the ER. Reports pain across the lower abdomen, more so to the right at this time, worse with movement and walking. Denies fevers, chills, no blood in stools (loose). No prior abdominal surgeries, has never had a colonoscopy.         Past Medical History:  Diagnosis Date  . Gilbert syndrome   . Perennial allergic rhinitis with seasonal variation     Patient Active Problem List   Diagnosis Date Noted  . Other and unspecified hyperlipidemia 01/02/2013  . GILBERT'S SYNDROME 02/27/2008  . Seasonal rhinitis 02/27/2008    Past Surgical History:  Procedure Laterality Date  . WISDOM TOOTH EXTRACTION         Family History  Problem Relation Age of Onset  . Breast cancer Mother   . Heart attack Maternal Grandfather        late 56s  . Diabetes Maternal Grandfather   . Hypertension Paternal Grandmother   . Stroke Neg Hx     Social History   Tobacco Use  . Smoking status: Former Smoker    Quit date: 12/14/1996    Years since quitting: 24.1  . Smokeless tobacco: Never Used  . Tobacco comment: smoked 1989-1998; up to 1 pack per  week  Substance Use Topics  . Alcohol use: Yes    Alcohol/week: 14.0 standard drinks    Types: 14 Cans of beer per week    Comment: Socially  . Drug use: No    Home Medications Prior to Admission medications   Medication Sig Start Date End Date Taking? Authorizing Provider  Ascorbic Acid (VITAMIN C PO) Take 1 tablet by mouth daily.   Yes [provider]  dicyclomine (BENTYL) 20 MG tablet Take 1 tablet (20 mg total) by mouth 2 (two) times daily as needed for spasms (and abdominal pain). 01/31/21  Yes Amyot, Nicholes Stairs, NP  FIBER PO Take 1 tablet by mouth daily.   Yes [provider]  ibuprofen (ADVIL) 200 MG tablet Take 200 mg by mouth every 6 (six) hours as needed for mild pain or headache.   Yes [provider]  Multiple Vitamin (MULTIVITAMIN WITH MINERALS) TABS tablet Take 1 tablet by mouth daily.   Yes [provider]    Allergies    Patient has no known allergies.  Review of Systems   Review of Systems  Constitutional: Negative for chills and fever.  Respiratory: Negative for shortness of breath.   Cardiovascular: Negative for chest pain.  Gastrointestinal: Positive for abdominal pain, diarrhea and nausea. Negative for blood in stool, constipation  and vomiting.  Genitourinary: Negative for dysuria and frequency.  Musculoskeletal: Negative for back pain.  Skin: Negative for rash and wound.  Allergic/Immunologic: Negative for immunocompromised state.  Neurological: Negative for weakness.  All other systems reviewed and are negative.   Physical Exam Updated Vital Signs BP (!) 141/94   Pulse 90   Temp 98.7 F (37.1 C) (Oral)   Resp 13   SpO2 99%   Physical Exam Vitals and nursing note reviewed.  Constitutional:      General: He is not in acute distress.    Appearance: He is well-developed and well-nourished. He is not diaphoretic.  HENT:     Head: Normocephalic and atraumatic.  Cardiovascular:     Rate and Rhythm: Normal rate and  regular rhythm.     Heart sounds: Normal heart sounds.  Pulmonary:     Effort: Pulmonary effort is normal.     Breath sounds: Normal breath sounds.  Abdominal:     Palpations: Abdomen is soft.     Tenderness: There is abdominal tenderness in the right lower quadrant. There is guarding.  Skin:    General: Skin is warm and dry.     Findings: No erythema or rash.  Neurological:     Mental Status: He is alert and oriented to person, place, and time.  Psychiatric:        Mood and Affect: Mood and affect normal.        Behavior: Behavior normal.     ED Results / Procedures / Treatments   Labs (all labs ordered are listed, but only abnormal results are displayed) Labs Reviewed  URINALYSIS, ROUTINE W REFLEX MICROSCOPIC - Abnormal; Notable for the following components:      Result Value   Color, Urine AMBER (*)    APPearance HAZY (*)    Protein, ur 30 (*)    Bacteria, UA RARE (*)    All other components within normal limits  COMPREHENSIVE METABOLIC PANEL - Abnormal; Notable for the following components:   Sodium 134 (*)    Glucose, Bld 108 (*)    Albumin 3.2 (*)    ALT 49 (*)    All other components within normal limits  CBC WITH DIFFERENTIAL/PLATELET - Abnormal; Notable for the following components:   WBC 23.6 (*)    Platelets 436 (*)    Neutro Abs 19.9 (*)    Monocytes Absolute 1.9 (*)    Abs Immature Granulocytes 0.36 (*)    All other components within normal limits  RESP PANEL BY RT-PCR (FLU A&B, COVID) ARPGX2  CULTURE, BLOOD (ROUTINE X 2)  CULTURE, BLOOD (ROUTINE X 2)  URINE CULTURE  PROTIME-INR  APTT  LACTIC ACID, PLASMA  LACTIC ACID, PLASMA    EKG None  Radiology CT ABDOMEN PELVIS W CONTRAST  Result Date: 02/01/2021 CLINICAL DATA:  Abdominal pain for 2 weeks EXAM: CT ABDOMEN AND PELVIS WITH CONTRAST TECHNIQUE: Multidetector CT imaging of the abdomen and pelvis was performed using the standard protocol following bolus administration of intravenous contrast.  Sagittal and coronal MPR images reconstructed from axial data set. CONTRAST:  131mL OMNIPAQUE IOHEXOL 300 MG/ML SOLN IV. No oral contrast. COMPARISON:  None FINDINGS: Lower chest: Lung bases clear Hepatobiliary: Gallbladder and liver normal appearance Pancreas: Normal appearance. Spleen: Normal appearance.  Small splenule anterior to spleen. Adrenals/Urinary Tract: Mild dilatation of RIGHT renal collecting system and proximal RIGHT ureter with decompressed distal RIGHT ureter, likely due to process in RIGHT mid abdomen, see below. Adrenal glands, kidneys, ureters,  and bladder otherwise normal appearance. Stomach/Bowel: Large inflammatory process identified medial to the cecum at proximal ascending colon measuring 9.5 x 8.6 x 9.4 cm. Collection contains air, fluid, and surrounding inflammatory changes. A portion of the margin is well-defined enhancing while additional areas are less well-defined, with scattered edema and soft tissue gas. A single small calcification is seen within the collection 5 mm diameter which could represent an appendicolith. The appendix is not well identified within this collection/area. Distal colonic diverticulosis involving descending and sigmoid colon the inflammatory process does abut the sigmoid colon though only minimal colonic wall thickening is identified. The epicenter of the process appears to be centered medial to the cecum rather than at the sigmoid colon. Findings most favor perforated appendicitis rather than diverticulitis. Vascular/Lymphatic: Vascular structures patent.  No adenopathy. Reproductive: Unremarkable prostate gland and seminal vesicles Other: BILATERAL inguinal hernias containing fat. No free air or free fluid. Tiny umbilical hernia containing fat. Musculoskeletal: Unremarkable IMPRESSION: Large inflammatory process medial to the cecum 9.5 x 8.6 x 9.4 cm in size containing fluid, gas, and surrounding inflammatory changes. This likely represents perforated  appendicitis with a large phlegmon and developing abscess though this is not entirely well-circumscribed; while this does abut the sigmoid colon which has evidence of diverticulosis, the epicenter of the inflammatory process is more centered in the RIGHT mid abdomen is more consistent with perforated appendicitis than perforated diverticulitis. Appendix is poorly localized within this collection. No free air or free fluid. BILATERAL inguinal umbilical hernias containing fat. Findings called to [facility] on 02/01/2021 at 1324 hours. Electronically Signed   By: Lavonia Dana M.D.   On: 02/01/2021 13:29   DG Chest Port 1 View  Result Date: 02/01/2021 CLINICAL DATA:  Sepsis.  Abdominal pain. EXAM: PORTABLE CHEST 1 VIEW COMPARISON:  None FINDINGS: The heart size and mediastinal contours are within normal limits. Both lungs are clear. The visualized skeletal structures are unremarkable. IMPRESSION: No active disease. Electronically Signed   By: Kerby Moors M.D.   On: 02/01/2021 11:40   DG Abd 2 Views  Result Date: 01/31/2021 CLINICAL DATA:  Low abdominal pain for 1 week. EXAM: ABDOMEN - 2 VIEW COMPARISON:  None FINDINGS: The bowel gas pattern is normal. There is no evidence of free air. No radio-opaque calculi or other significant radiographic abnormality is seen. IMPRESSION: Negative. Electronically Signed   By: Kerby Moors M.D.   On: 01/31/2021 11:52    Procedures .Critical Care Performed by: Tacy Learn, PA-C Authorized by: Tacy Learn, PA-C   Critical care provider statement:    Critical care time (minutes):  45   Critical care was time spent personally by me on the following activities:  Discussions with consultants, evaluation of patient's response to treatment, examination of patient, ordering and performing treatments and interventions, ordering and review of laboratory studies, ordering and review of radiographic studies, pulse oximetry, re-evaluation of patient's  condition, obtaining history from patient or surrogate and review of old charts     Medications Ordered in ED Medications  lactated ringers infusion ( Intravenous New Bag/Given 02/01/21 1209)  vancomycin (VANCOREADY) IVPB 1750 mg/350 mL (1,750 mg Intravenous New Bag/Given 02/01/21 1238)  lactated ringers bolus 1,000 mL (0 mLs Intravenous Stopped 02/01/21 1305)    And  lactated ringers bolus 1,000 mL (has no administration in time range)    And  lactated ringers bolus 1,000 mL (has no administration in time range)  ceFEPIme (MAXIPIME) 2 g in sodium chloride 0.9 %  100 mL IVPB (0 g Intravenous Stopped 02/01/21 1226)  metroNIDAZOLE (FLAGYL) IVPB 500 mg (0 mg Intravenous Stopped 02/01/21 1328)  iohexol (OMNIPAQUE) 300 MG/ML solution 100 mL (100 mLs Intravenous Contrast Given 02/01/21 1253)    ED Course  I have reviewed the triage vital signs and the nursing notes.  Pertinent labs & imaging results that were available during my care of the patient were reviewed by me and considered in my medical decision making (see chart for details).  Clinical Course as of 02/01/21 1346  Sat Feb 01, 2021  1226 50 yo male with abdominal pain, initially left side, now right lower. 1 episode of vomiting at onset of pain. Has had loose non bloody stools, is taking prune juice, feels like not completely moving bowels. Generalized tenderness, worse with palpation of RLQ with guarding.  Arrived in the ER with known elevated WBC, tachycardic on arrival at 120, code sepsis initiated. [LM]  1833 Labs reviewed, leukocytosis with white count of 23.6 with increase in neutrophils.  Urinalysis with protein and rare bacteria.  CMP with no significant changes.  INR normal, respiratory panel negative for Covid and flu.  Lactic acid is pending, patient was given IV fluids and antibiotics including Flagyl, vancomycin, and cefepime per protocol. [LM]  5825 CT returns with large inflammatory process medial to the cecum 9.5 x 8.6 x  9.4 cm in size containing fluid, gas, and surrounding inflammatory changes.  Likely represents perforated appendicitis with a large phlegmon and developing abscess though this is not entirely well-circumscribed, also consider diverticulosis. [LM]  71 Dr. Jeanell Sparrow, ER attending, has consulted with surgery who will admit. [LM]    Clinical Course User Index [LM] Roque Lias   MDM Rules/Calculators/A&P                          Final Clinical Impression(s) / ED Diagnoses Final diagnoses:  Acute appendicitis with perforation, generalized peritonitis, and abscess, without gangrene    Rx / DC Orders ED Discharge Orders    None       Tacy Learn, PA-C 02/01/21 1346    Pattricia Boss, MD 02/01/21 1350

## 2021-02-01 NOTE — ED Triage Notes (Signed)
Pt. Stated, Travis Forbes had stomach pain for 2 weeks and went to [place] [facility] and no results back. Went to UC yesterday and now Im here. UC could not give me the scans.

## 2021-02-01 NOTE — H&P (Signed)
Reason for Consult: abdominal pain Referring Physician: [name] is an 50 y.o. male.  HPI: 50 yo male with 10 days of abdominal pain. He initially thought it was [place] from airport. After 2 days of rest he was better but then gradually had worsening of abdominal pain. Pain is constant and in lower abdomen. He denies fevers, nausea, or vomiting.  Past Medical History:  Diagnosis Date  . Gilbert syndrome   . Perennial allergic rhinitis with seasonal variation     Past Surgical History:  Procedure Laterality Date  . WISDOM TOOTH EXTRACTION      Family History  Problem Relation Age of Onset  . Breast cancer Mother   . Heart attack Maternal Grandfather        late 34s  . Diabetes Maternal Grandfather   . Hypertension Paternal Grandmother   . Stroke Neg Hx     Social History:  reports that he quit smoking about 24 years ago. He has never used smokeless tobacco. He reports current alcohol use of about 14.0 standard drinks of alcohol per week. He reports that he does not use drugs.  Allergies: No Known Allergies  Medications: I have reviewed the patient's current medications.  Results for orders placed or performed during the hospital encounter of 02/01/21 (from the past 48 hour(s))  Urinalysis, Routine w reflex microscopic Urine, Clean Catch     Status: Abnormal   Collection Time: 02/01/21 10:31 AM  Result Value Ref Range   Color, Urine AMBER (A) YELLOW    Comment: BIOCHEMICALS MAY BE AFFECTED BY COLOR   APPearance HAZY (A) CLEAR   Specific Gravity, Urine 1.020 1.005 - 1.030   pH 6.0 5.0 - 8.0   Glucose, UA NEGATIVE NEGATIVE mg/dL   Hgb urine dipstick NEGATIVE NEGATIVE   Bilirubin Urine NEGATIVE NEGATIVE   Ketones, ur NEGATIVE NEGATIVE mg/dL   Protein, ur 30 (A) NEGATIVE mg/dL   Nitrite NEGATIVE NEGATIVE   Leukocytes,Ua NEGATIVE NEGATIVE   RBC / HPF 0-5 0 - 5 RBC/hpf   WBC, UA 0-5 0 - 5 WBC/hpf   Bacteria, UA RARE (A) NONE SEEN   Squamous  Epithelial / LPF 0-5 0 - 5   Mucus PRESENT     Comment: Performed at [facility], [address] [place],  [address]  Resp Panel by RT-PCR (Flu A&B, Covid) Nasopharyngeal Swab     Status: None   Collection Time: 02/01/21 10:58 AM   Specimen: Nasopharyngeal Swab; Nasopharyngeal(NP) swabs in vial transport medium  Result Value Ref Range   SARS Coronavirus 2 by RT PCR NEGATIVE NEGATIVE    Comment: (NOTE) SARS-CoV-2 target nucleic acids are NOT DETECTED.  The SARS-CoV-2 RNA is generally detectable in upper respiratory specimens during the acute phase of infection. The lowest concentration of SARS-CoV-2 viral copies this assay can detect is 138 copies/mL. A negative result does not preclude SARS-Cov-2 infection and should not be used as the sole basis for treatment or other patient management decisions. A negative result may occur with  improper specimen collection/handling, submission of specimen other than nasopharyngeal swab, presence of viral mutation(s) within the areas targeted by this assay, and inadequate number of viral copies(<138 copies/mL). A negative result must be combined with clinical observations, patient history, and epidemiological information. The expected result is Negative.  Fact Sheet for Patients:  EntrepreneurPulse.com.au  Fact Sheet for Healthcare Providers:  IncredibleEmployment.be  This test is no t yet approved or cleared by the [place]  States FDA and  has been authorized for detection and/or diagnosis of SARS-CoV-2 by FDA under an Emergency Use Authorization (EUA). This EUA will remain  in effect (meaning this test can be used) for the duration of the COVID-19 declaration under Section 564(b)(1) of the Act, 21 U.S.C.section 360bbb-3(b)(1), unless the authorization is terminated  or revoked sooner.       Influenza A by PCR NEGATIVE NEGATIVE   Influenza B by PCR NEGATIVE NEGATIVE    Comment: (NOTE) The  Xpert Xpress SARS-CoV-2/FLU/RSV plus assay is intended as an aid in the diagnosis of influenza from Nasopharyngeal swab specimens and should not be used as a sole basis for treatment. Nasal washings and aspirates are unacceptable for Xpert Xpress SARS-CoV-2/FLU/RSV testing.  Fact Sheet for Patients: EntrepreneurPulse.com.au  Fact Sheet for Healthcare Providers: IncredibleEmployment.be  This test is not yet approved or cleared by the [place] FDA and has been authorized for detection and/or diagnosis of SARS-CoV-2 by FDA under an Emergency Use Authorization (EUA). This EUA will remain in effect (meaning this test can be used) for the duration of the COVID-19 declaration under Section 564(b)(1) of the Act, 21 U.S.C. section 360bbb-3(b)(1), unless the authorization is terminated or revoked.  Performed at [facility], [place] [address] [place], [place] [address]   Lactic acid, plasma     Status: None   Collection Time: 02/01/21 12:14 PM  Result Value Ref Range   Lactic Acid, Venous 1.4 0.5 - 1.9 mmol/L    Comment: Performed at [facility] [address] [place], [place] [address]  Comprehensive metabolic panel     Status: Abnormal   Collection Time: 02/01/21 12:14 PM  Result Value Ref Range   Sodium 134 (L) 135 - 145 mmol/L   Potassium 3.8 3.5 - 5.1 mmol/L   Chloride 98 98 - 111 mmol/L   CO2 23 22 - 32 mmol/L   Glucose, Bld 108 (H) 70 - 99 mg/dL    Comment: Glucose reference range applies only to samples taken after fasting for at least 8 hours.   BUN 7 6 - 20 mg/dL   Creatinine, Ser 0.83 0.61 - 1.24 mg/dL   Calcium 9.1 8.9 - 10.3 mg/dL   Total Protein 7.7 6.5 - 8.1 g/dL   Albumin 3.2 (L) 3.5 - 5.0 g/dL   AST 33 15 - 41 U/L   ALT 49 (H) 0 - 44 U/L   Alkaline Phosphatase 74 38 - 126 U/L   Total Bilirubin 1.1 0.3 - 1.2 mg/dL   GFR, Estimated >60 >60 mL/min    Comment: (NOTE) Calculated using the CKD-EPI Creatinine  Equation (2021)    Anion gap 13 5 - 15    Comment: Performed at [facility], [place] [address] [place],  [address]  CBC WITH DIFFERENTIAL     Status: Abnormal   Collection Time: 02/01/21 12:14 PM  Result Value Ref Range   WBC 23.6 (H) 4.0 - 10.5 K/uL   RBC 4.63 4.22 - 5.81 MIL/uL   Hemoglobin 14.4 13.0 - 17.0 g/dL   HCT 41.5 39.0 - 52.0 %   MCV 89.6 80.0 - 100.0 fL   MCH 31.1 26.0 - 34.0 pg   MCHC 34.7 30.0 - 36.0 g/dL   RDW 11.6 11.5 - 15.5 %   Platelets 436 (H) 150 - 400 K/uL   nRBC 0.0 0.0 - 0.2 %   Neutrophils Relative % 84 %   Neutro Abs 19.9 (H) 1.7 - 7.7  K/uL   Lymphocytes Relative 6 %   Lymphs Abs 1.4 0.7 - 4.0 K/uL   Monocytes Relative 8 %   Monocytes Absolute 1.9 (H) 0.1 - 1.0 K/uL   Eosinophils Relative 0 %   Eosinophils Absolute 0.0 0.0 - 0.5 K/uL   Basophils Relative 0 %   Basophils Absolute 0.1 0.0 - 0.1 K/uL   Immature Granulocytes 2 %   Abs Immature Granulocytes 0.36 (H) 0.00 - 0.07 K/uL    Comment: Performed at [facility] [address] [place], [place] [address]  Protime-INR     Status: None   Collection Time: 02/01/21 12:14 PM  Result Value Ref Range   Prothrombin Time 13.7 11.4 - 15.2 seconds   INR 1.1 0.8 - 1.2    Comment: (NOTE) INR goal varies based on device and disease states. Performed at [facility], [place] [address] [place], [place] [address]   APTT     Status: None   Collection Time: 02/01/21 12:14 PM  Result Value Ref Range   aPTT 26 24 - 36 seconds    Comment: Performed at [facility] [address], [place], [place] [address]    CT ABDOMEN PELVIS W CONTRAST  Result Date: 02/01/2021 CLINICAL DATA:  Abdominal pain for 2 weeks EXAM: CT ABDOMEN AND PELVIS WITH CONTRAST TECHNIQUE: Multidetector CT imaging of the abdomen and pelvis was performed using the standard protocol following bolus administration of intravenous contrast. Sagittal and coronal MPR images reconstructed from axial data set. CONTRAST:   133mL OMNIPAQUE IOHEXOL 300 MG/ML SOLN IV. No oral contrast. COMPARISON:  None FINDINGS: Lower chest: Lung bases clear Hepatobiliary: Gallbladder and liver normal appearance Pancreas: Normal appearance. Spleen: Normal appearance.  Small splenule anterior to spleen. Adrenals/Urinary Tract: Mild dilatation of RIGHT renal collecting system and proximal RIGHT ureter with decompressed distal RIGHT ureter, likely due to process in RIGHT mid abdomen, see below. Adrenal glands, kidneys, ureters, and bladder otherwise normal appearance. Stomach/Bowel: Large inflammatory process identified medial to the cecum at proximal ascending colon measuring 9.5 x 8.6 x 9.4 cm. Collection contains air, fluid, and surrounding inflammatory changes. A portion of the margin is well-defined enhancing while additional areas are less well-defined, with scattered edema and soft tissue gas. A single small calcification is seen within the collection 5 mm diameter which could represent an appendicolith. The appendix is not well identified within this collection/area. Distal colonic diverticulosis involving descending and sigmoid colon the inflammatory process does abut the sigmoid colon though only minimal colonic wall thickening is identified. The epicenter of the process appears to be centered medial to the cecum rather than at the sigmoid colon. Findings most favor perforated appendicitis rather than diverticulitis. Vascular/Lymphatic: Vascular structures patent.  No adenopathy. Reproductive: Unremarkable prostate gland and seminal vesicles Other: BILATERAL inguinal hernias containing fat. No free air or free fluid. Tiny umbilical hernia containing fat. Musculoskeletal: Unremarkable IMPRESSION: Large inflammatory process medial to the cecum 9.5 x 8.6 x 9.4 cm in size containing fluid, gas, and surrounding inflammatory changes. This likely represents perforated appendicitis with a large phlegmon and developing abscess though this is not entirely  well-circumscribed; while this does abut the sigmoid colon which has evidence of diverticulosis, the epicenter of the inflammatory process is more centered in the RIGHT mid abdomen is more consistent with perforated appendicitis than perforated diverticulitis. Appendix is poorly localized within this collection. No free air or free fluid. BILATERAL inguinal umbilical hernias containing fat. Findings called to [facility] on 02/01/2021 at 1324 hours. Electronically Signed   By: Lavonia Dana M.D.   On: 02/01/2021 13:29   DG Chest Port 1 View  Result Date: 02/01/2021 CLINICAL DATA:  Sepsis.  Abdominal pain. EXAM: PORTABLE CHEST 1 VIEW COMPARISON:  None FINDINGS: The heart size and mediastinal contours are within normal limits. Both lungs are clear. The visualized skeletal structures are unremarkable. IMPRESSION: No active disease. Electronically Signed   By: Kerby Moors M.D.   On: 02/01/2021 11:40   DG Abd 2 Views  Result Date: 01/31/2021 CLINICAL DATA:  Low abdominal pain for 1 week. EXAM: ABDOMEN - 2 VIEW COMPARISON:  None FINDINGS: The bowel gas pattern is normal. There is no evidence of free air. No radio-opaque calculi or other significant radiographic abnormality is seen. IMPRESSION: Negative. Electronically Signed   By: Kerby Moors M.D.   On: 01/31/2021 11:52    Review of Systems  Constitutional: Negative for chills and fever.  HENT: Negative for hearing loss.   Eyes: Negative for blurred vision and double vision.  Respiratory: Negative for cough and hemoptysis.   Cardiovascular: Negative for chest pain and palpitations.  Gastrointestinal: Positive for abdominal pain. Negative for nausea and vomiting.  Genitourinary: Negative for dysuria and urgency.  Musculoskeletal: Negative for myalgias and neck pain.  Skin: Negative for itching and rash.  Neurological: Negative for dizziness, tingling and headaches.  Endo/Heme/Allergies: Does not bruise/bleed easily.   Psychiatric/Behavioral: Negative for depression and suicidal ideas.    PE Blood pressure 131/82, pulse 96, temperature 98.7 F (37.1 C), temperature source Oral, resp. rate 15, SpO2 99 %. Constitutional: NAD; conversant; no deformities Eyes: Moist conjunctiva; no lid lag; anicteric; PERRL Neck: Trachea midline; no thyromegaly Lungs: Normal respiratory effort; no tactile fremitus CV: RRR; no palpable thrills; no pitting edema GI: Abd tender in lower mid abdomen, no guarding; no palpable hepatosplenomegaly MSK: Normal gait; no clubbing/cyanosis Psychiatric: Appropriate affect; alert and oriented x3 Lymphatic: No palpable cervical or axillary lymphadenopathy Skin: No major subcutaneous nodules. Warm and dry   Assessment/Plan: 50 yo male with large abdominal abscess likely from appendicitis -admit to surgical floor -IV abx -pain control -consult IR for potential drain -NPO now, clears if no procedure tonight  Arta Bruce Frederika Hukill 02/01/2021, 3:17 PM

## 2021-02-01 NOTE — ED Notes (Signed)
Admit MD at bedside

## 2021-02-02 DIAGNOSIS — E669 Obesity, unspecified: Secondary | ICD-10-CM

## 2021-02-02 LAB — COMPREHENSIVE METABOLIC PANEL
ALT: 40 U/L (ref 0–44)
AST: 21 U/L (ref 15–41)
Albumin: 2.4 g/dL — ABNORMAL LOW (ref 3.5–5.0)
Alkaline Phosphatase: 59 U/L (ref 38–126)
Anion gap: 10 (ref 5–15)
BUN: 6 mg/dL (ref 6–20)
CO2: 25 mmol/L (ref 22–32)
Calcium: 8.4 mg/dL — ABNORMAL LOW (ref 8.9–10.3)
Chloride: 101 mmol/L (ref 98–111)
Creatinine, Ser: 0.83 mg/dL (ref 0.61–1.24)
GFR, Estimated: >60 mL/min (ref >60)
Glucose, Bld: 102 mg/dL — ABNORMAL HIGH (ref 70–99)
Potassium: 3.6 mmol/L (ref 3.5–5.1)
Sodium: 136 mmol/L (ref 135–145)
Total Bilirubin: 1.3 mg/dL — ABNORMAL HIGH (ref 0.3–1.2)
Total Protein: 5.8 g/dL — ABNORMAL LOW (ref 6.5–8.1)

## 2021-02-02 LAB — CBC
HCT: 35.4 % — ABNORMAL LOW (ref 39.0–52.0)
Hemoglobin: 11.9 g/dL — ABNORMAL LOW (ref 13.0–17.0)
MCH: 30.6 pg (ref 26.0–34.0)
MCHC: 33.6 g/dL (ref 30.0–36.0)
MCV: 91 fL (ref 80.0–100.0)
Platelets: 346 10*3/uL (ref 150–400)
RBC: 3.89 MIL/uL — ABNORMAL LOW (ref 4.22–5.81)
RDW: 11.5 % (ref 11.5–15.5)
WBC: 18.3 10*3/uL — ABNORMAL HIGH (ref 4.0–10.5)
nRBC: 0 % (ref 0.0–0.2)

## 2021-02-02 LAB — HIV ANTIBODY (ROUTINE TESTING W REFLEX): HIV Screen 4th Generation wRfx: NONREACTIVE

## 2021-02-02 LAB — PREALBUMIN: Prealbumin: 7 mg/dL — ABNORMAL LOW (ref 18–38)

## 2021-02-02 MED ORDER — ACETAMINOPHEN 650 MG RE SUPP: 650.0000 mg | Freq: Four times a day (QID) | RECTAL | Status: DC | PRN

## 2021-02-02 MED ORDER — MAGIC MOUTHWASH
15.0000 mL | Freq: Four times a day (QID) | ORAL | Status: DC | PRN
  Filled 2021-02-02: qty 15

## 2021-02-02 MED ORDER — DIPHENHYDRAMINE HCL 50 MG/ML IJ SOLN: 12.5000 mg | Freq: Four times a day (QID) | INTRAMUSCULAR | Status: DC | PRN

## 2021-02-02 MED ORDER — BOOST / RESOURCE BREEZE PO LIQD CUSTOM
1.0000 | Freq: Three times a day (TID) | ORAL | Status: DC
  Administered 2021-02-02 – 2021-02-04 (×5): 1 via ORAL

## 2021-02-02 MED ORDER — METOPROLOL TARTRATE 5 MG/5ML IV SOLN: 5.0000 mg | Freq: Four times a day (QID) | INTRAVENOUS | Status: DC | PRN

## 2021-02-02 MED ORDER — METHOCARBAMOL 1000 MG/10ML IJ SOLN
1000.0000 mg | Freq: Four times a day (QID) | INTRAVENOUS | Status: DC | PRN
  Filled 2021-02-02: qty 10

## 2021-02-02 MED ORDER — LIP MEDEX EX OINT
1.0000 "application " | TOPICAL_OINTMENT | Freq: Two times a day (BID) | CUTANEOUS | Status: DC
  Administered 2021-02-02 – 2021-02-04 (×4): 1 via TOPICAL
  Filled 2021-02-02: qty 7

## 2021-02-02 MED ORDER — LACTATED RINGERS IV BOLUS: 1000.0000 mL | Freq: Three times a day (TID) | INTRAVENOUS | Status: DC | PRN

## 2021-02-02 MED ORDER — BISACODYL 10 MG RE SUPP: 10.0000 mg | Freq: Two times a day (BID) | RECTAL | Status: DC | PRN

## 2021-02-02 MED ORDER — PROCHLORPERAZINE EDISYLATE 10 MG/2ML IJ SOLN: 5.0000 mg | INTRAMUSCULAR | Status: DC | PRN

## 2021-02-02 NOTE — Consult Note (Signed)
Chief Complaint: Patient was seen in consultation today for  Chief Complaint  Patient presents with  . Abdominal Pain    Referring Physician(s): Dr. Johney Maine  Supervising Physician: Corrie Mckusick  Patient Status: [facility] - In-pt  History of Present Illness: Travis Forbes is a 50 y.o. male with a medical history significant for Gilbert's syndrome. He presented to the [facility] ED 01/31/21 with abdominal pain of 10 day's duration that he initially thought was food poisoning. CT imaging had findings suspicious for acute appendicitis with perforation and abscess formation. Lab work was notable for a WBC of 22.3  CT abdomen/Pelvis 02/01/21 IMPRESSION: 1. Large inflammatory process medial to the cecum 9.5 x 8.6 x 9.4 cm in size containing fluid, gas, and surrounding inflammatory changes. This likely represents perforated appendicitis with a large phlegmon and developing abscess though this is not entirely well-circumscribed; while this does abut the sigmoid colon which has evidence of diverticulosis, the epicenter of the inflammatory process is more centered in the RIGHT mid abdomen is more consistent with perforated appendicitis than perforated diverticulitis. Appendix is poorly localized within this collection. 2. No free air or free fluid. 3. BILATERAL inguinal umbilical hernias containing fat.  Interventional Radiology has been asked to evaluate this patient for an image-guided intra-abdominal fluid collection aspiration with drain placement. This case has been reviewed and procedure approved by Dr. Earleen Newport.   Past Medical History:  Diagnosis Date  . Gilbert syndrome   . Perennial allergic rhinitis with seasonal variation     Past Surgical History:  Procedure Laterality Date  . WISDOM TOOTH EXTRACTION      Allergies: Patient has no known allergies.  Medications: Prior to Admission medications   Medication Sig Start Date End Date Taking? Authorizing Provider  Ascorbic Acid  (VITAMIN C PO) Take 1 tablet by mouth daily.   Yes [provider]  dicyclomine (BENTYL) 20 MG tablet Take 1 tablet (20 mg total) by mouth 2 (two) times daily as needed for spasms (and abdominal pain). 01/31/21  Yes Amyot, Nicholes Stairs, NP  FIBER PO Take 1 tablet by mouth daily.   Yes [provider]  ibuprofen (ADVIL) 200 MG tablet Take 200 mg by mouth every 6 (six) hours as needed for mild pain or headache.   Yes [provider]  Multiple Vitamin (MULTIVITAMIN WITH MINERALS) TABS tablet Take 1 tablet by mouth daily.   Yes [provider]     Family History  Problem Relation Age of Onset  . Breast cancer Mother   . Heart attack Maternal Grandfather        late 38s  . Diabetes Maternal Grandfather   . Hypertension Paternal Grandmother   . Stroke Neg Hx     Social History   Socioeconomic History  . Marital status: Married    Spouse name: Not on file  . Number of children: Not on file  . Years of education: Not on file  . Highest education level: Not on file  Occupational History  . Not on file  Tobacco Use  . Smoking status: Former Smoker    Quit date: 12/14/1996    Years since quitting: 24.1  . Smokeless tobacco: Never Used  . Tobacco comment: smoked 1989-1998; up to 1 pack per week  Substance and Sexual Activity  . Alcohol use: Yes    Alcohol/week: 14.0 standard drinks    Types: 14 Cans of beer per week    Comment: Socially  . Drug use: No  .  Sexual activity: Not on file  Other Topics Concern  . Not on file  Social History Narrative  . Not on file   Social Determinants of Health   Financial Resource Strain: Not on file  Food Insecurity: Not on file  Transportation Needs: Not on file  Physical Activity: Not on file  Stress: Not on file  Social Connections: Not on file    Review of Systems: A 12 point ROS discussed and pertinent positives are indicated in the HPI above.  All other systems are negative.  Review of Systems   Constitutional: Negative for appetite change and fatigue.  Respiratory: Negative for cough and shortness of breath.   Cardiovascular: Negative for chest pain and leg swelling.  Gastrointestinal: Positive for diarrhea. Negative for abdominal pain, nausea and vomiting.  Musculoskeletal: Negative for back pain.  Neurological: Negative for dizziness and headaches.    Vital Signs: BP 120/79 (BP Location: Left Arm)   Pulse 71   Temp 98.9 F (37.2 C) (Oral)   Resp 17   Ht 5\' 9"  (1.753 m)   Wt 204 lb (92.5 kg)   SpO2 100%   BMI 30.13 kg/m   Physical Exam Constitutional:      General: He is not in acute distress.    Appearance: He is not ill-appearing.  HENT:     Mouth/Throat:     Mouth: Mucous membranes are moist.     Pharynx: Oropharynx is clear.  Cardiovascular:     Rate and Rhythm: Normal rate and regular rhythm.     Pulses: Normal pulses.     Heart sounds: Normal heart sounds.  Pulmonary:     Effort: Pulmonary effort is normal.     Breath sounds: Normal breath sounds.  Abdominal:     General: Bowel sounds are normal.     Palpations: Abdomen is soft.     Tenderness: There is no abdominal tenderness.  Musculoskeletal:        General: Normal range of motion.  Skin:    General: Skin is warm and dry.  Neurological:     Mental Status: He is alert and oriented to person, place, and time.     Imaging: CT ABDOMEN PELVIS W CONTRAST  Result Date: 02/01/2021 CLINICAL DATA:  Abdominal pain for 2 weeks EXAM: CT ABDOMEN AND PELVIS WITH CONTRAST TECHNIQUE: Multidetector CT imaging of the abdomen and pelvis was performed using the standard protocol following bolus administration of intravenous contrast. Sagittal and coronal MPR images reconstructed from axial data set. CONTRAST:  118mL OMNIPAQUE IOHEXOL 300 MG/ML SOLN IV. No oral contrast. COMPARISON:  None FINDINGS: Lower chest: Lung bases clear Hepatobiliary: Gallbladder and liver normal appearance Pancreas: Normal appearance.  Spleen: Normal appearance.  Small splenule anterior to spleen. Adrenals/Urinary Tract: Mild dilatation of RIGHT renal collecting system and proximal RIGHT ureter with decompressed distal RIGHT ureter, likely due to process in RIGHT mid abdomen, see below. Adrenal glands, kidneys, ureters, and bladder otherwise normal appearance. Stomach/Bowel: Large inflammatory process identified medial to the cecum at proximal ascending colon measuring 9.5 x 8.6 x 9.4 cm. Collection contains air, fluid, and surrounding inflammatory changes. A portion of the margin is well-defined enhancing while additional areas are less well-defined, with scattered edema and soft tissue gas. A single small calcification is seen within the collection 5 mm diameter which could represent an appendicolith. The appendix is not well identified within this collection/area. Distal colonic diverticulosis involving descending and sigmoid colon the inflammatory process does abut the sigmoid colon though only  minimal colonic wall thickening is identified. The epicenter of the process appears to be centered medial to the cecum rather than at the sigmoid colon. Findings most favor perforated appendicitis rather than diverticulitis. Vascular/Lymphatic: Vascular structures patent.  No adenopathy. Reproductive: Unremarkable prostate gland and seminal vesicles Other: BILATERAL inguinal hernias containing fat. No free air or free fluid. Tiny umbilical hernia containing fat. Musculoskeletal: Unremarkable IMPRESSION: Large inflammatory process medial to the cecum 9.5 x 8.6 x 9.4 cm in size containing fluid, gas, and surrounding inflammatory changes. This likely represents perforated appendicitis with a large phlegmon and developing abscess though this is not entirely well-circumscribed; while this does abut the sigmoid colon which has evidence of diverticulosis, the epicenter of the inflammatory process is more centered in the RIGHT mid abdomen is more consistent  with perforated appendicitis than perforated diverticulitis. Appendix is poorly localized within this collection. No free air or free fluid. BILATERAL inguinal umbilical hernias containing fat. Findings called to [facility] on 02/01/2021 at 1324 hours. Electronically Signed   By: Lavonia Dana M.D.   On: 02/01/2021 13:29   DG Chest Port 1 View  Result Date: 02/01/2021 CLINICAL DATA:  Sepsis.  Abdominal pain. EXAM: PORTABLE CHEST 1 VIEW COMPARISON:  None FINDINGS: The heart size and mediastinal contours are within normal limits. Both lungs are clear. The visualized skeletal structures are unremarkable. IMPRESSION: No active disease. Electronically Signed   By: Kerby Moors M.D.   On: 02/01/2021 11:40   DG Abd 2 Views  Result Date: 01/31/2021 CLINICAL DATA:  Low abdominal pain for 1 week. EXAM: ABDOMEN - 2 VIEW COMPARISON:  None FINDINGS: The bowel gas pattern is normal. There is no evidence of free air. No radio-opaque calculi or other significant radiographic abnormality is seen. IMPRESSION: Negative. Electronically Signed   By: Kerby Moors M.D.   On: 01/31/2021 11:52    Labs:  CBC: Recent Labs    01/31/21 1158 02/01/21 1214 02/02/21 0127  WBC 22.3* 23.6* 18.3*  HGB 13.7 14.4 11.9*  HCT 40.2 41.5 35.4*  PLT 394 436* 346    COAGS: Recent Labs    02/01/21 1214  INR 1.1  APTT 26    BMP: Recent Labs    01/31/21 1158 02/01/21 1214 02/02/21 0127  NA 133* 134* 136  K 3.8 3.8 3.6  CL 98 98 101  CO2 24 23 25   GLUCOSE 108* 108* 102*  BUN 6 7 6   CALCIUM 9.0 9.1 8.4*  CREATININE 0.88 0.83 0.83  GFRNONAA >60 >60 >60    LIVER FUNCTION TESTS: Recent Labs    01/31/21 1158 02/01/21 1214 02/02/21 0127  BILITOT 1.3* 1.1 1.3*  AST 25 33 21  ALT 42 49* 40  ALKPHOS 73 74 59  PROT 7.2 7.7 5.8*  ALBUMIN 3.1* 3.2* 2.4*    TUMOR MARKERS: No results for input(s): AFPTM, CEA, CA199, CHROMGRNA in the last 8760 hours.  Assessment and Plan:  Acute appendicitis with  perforation; intra-abdominal fluid collection: Travis Forbes, 50 year old male, is tentatively scheduled for 02/03/21 for an image-guided intra-abdominal fluid collection aspiration with drain placement.   Risks and benefits discussed with the patient including bleeding, infection, damage to adjacent structures, bowel perforation/fistula connection, and sepsis.  All of the patient's questions were answered, patient is agreeable to proceed. He will be NPO at midnight. AM labs are ordered. Lovenox will be held.   Consent signed and in IR  Thank you for this interesting consult.  I greatly enjoyed meeting Travis Forbes and look forward to participating in their care.  A copy of this report was sent to the requesting provider on this date.  Electronically Signed: Soyla Dryer, AGACNP-BC [phone] 02/02/2021, 10:41 AM   I spent a total of 20 Minutes    in face to face in clinical consultation, greater than 50% of which was counseling/coordinating care for an image-guided intra-abdominal fluid collection aspiration with drain placement.

## 2021-02-02 NOTE — Progress Notes (Signed)
Travis Forbes is a 50 y.o. male patient admitted. Awake, alert - oriented  X 4 - no acute distress noted.  VSS - Blood pressure 129/82, pulse 86, temperature 100.2 F (37.9 C), temperature source Oral, resp. rate 18, height 5\' 9"  (1.753 m), weight 92.5 kg, SpO2 97 %.    IV in place, occlusive dsg intact without redness.  Will cont to eval and treat per MD orders.  Vidal Schwalbe, RN 02/01/2021 2030

## 2021-02-02 NOTE — Progress Notes (Signed)
Travis Forbes [id number] 09/05/1971  CARE TEAM:  PCP: Pcp, No  Outpatient Care Team: Patient Care Team: Pcp, No as PCP - General  Inpatient Treatment Team: Treatment Team: Attending Provider: Edison Pace, Md, MD; Rounding Team: Edison Pace, Md, MD; Technician: Gasper Sells, [place]; Registered Nurse: Elsie Lincoln, RN; [place] Management: Lindell Noe, RN; Social Worker: Waynette Buttery, LCSW   Problem List:   Principal Problem:   Acute appendicitis with perforation and localized peritonitis, with abscess Active Problems:   Disorder of bilirubin excretion   Rosanna Randy syndrome   Obesity (BMI 30-39.9)      * No surgery found *      Assessment  Stabilizing  [facility] Stay = 1 days)  Plan:  -IV antibiotics.  Zosyn per appendicitis protocol for complicated appendicitis.  Requesting event urology for drain placement.  Do not know if there is a good window since it seems somewhat central, but try to attempt in the hopes of getting this under better control.  If not able, restart clear liquids and have follow-up CAT scan in 5 days to see if improvement or progression.  If not controlled with these measures, may require operative intervention this admission.  Try and hold off to minimize the risk of need for ileocecectomy and ileostomy since very complicated.  Her recovers, would suspect patient would benefit from interval appendectomy in about 6 weeks.  Defer to Dr. Kieth Brightly since he will be managing the patient for progressive hospitalization.  -VTE prophylaxis- SCDs, etc -mobilize as tolerated to help recovery  Disposition:  Disposition:  The patient is from: Home  Anticipate discharge to:  Home  Anticipated Date of Discharge is:  February 24,2022    Barriers to discharge:  Pending Clinical improvement (more likely than not)  Patient currently is NOT MEDICALLY STABLE for discharge from the hospital from a surgery standpoint.      25  minutes spent in review, evaluation, examination, counseling, and coordination of care.   I have reviewed this patient's available data, including medical history, events of note, physical examination and test results as part of my evaluation.  A significant portion of that time was spent in counseling.  Care during the described time interval was provided by me.  02/02/2021    Subjective: (Chief complaint)  Patient denies any severe abdominal pain.  No nausea or vomiting.  Objective:  Vital signs:  Vitals:   02/01/21 2029 02/02/21 0255 02/02/21 0520 02/02/21 0800  BP: 129/82 113/88 120/79   Pulse: 86 77 71   Resp: 18 18 17 17   Temp: 100.2 F (37.9 C) 98.6 F (37 C) 98.9 F (37.2 C)   TempSrc: Oral Oral Oral   SpO2: 97% 100% 100% 100%  Weight:      Height:        Last BM Date: 03/01/21  Intake/Output   Yesterday:  02/19 0701 - 02/20 0700 In: 3833.8 [P.O.:360; I.V.:2256.4; IV Piggyback:1217.4] Out: -  This shift:  No intake/output data recorded.  Bowel function:  Flatus: No  BM:  No  Drain: (No drain)   Physical Exam:  General: Pt awake/alert in no acute distress Eyes: PERRL, normal EOM.  Sclera clear.  No icterus Neuro: CN II-XII intact w/o focal sensory/motor deficits. Lymph: No head/neck/groin lymphadenopathy Psych:  No delerium/psychosis/paranoia.  Oriented x 4 HENT: Normocephalic, Mucus membranes moist.  No thrush Neck: Supple, No tracheal deviation.  No obvious thyromegaly Chest: No pain to chest wall compression.  Good respiratory  excursion.  No audible wheezing CV:  Pulses intact.  Regular rhythm.  No major extremity edema MS: Normal AROM mjr joints.  No obvious deformity  Abdomen: Soft.  Mildy distended.  Tenderness at RLQ with mild guarding.  No evidence of peritonitis.  No incarcerated hernias.  Ext:   No deformity.  No mjr edema.  No cyanosis Skin: No petechiae / purpurea.  No major sores.  Warm and dry    Results:   Cultures: Recent  Results (from the past 720 hour(s))  Resp Panel by RT-PCR (Flu A&B, Covid) Nasopharyngeal Swab     Status: None   Collection Time: 02/01/21 10:58 AM   Specimen: Nasopharyngeal Swab; Nasopharyngeal(NP) swabs in vial transport medium  Result Value Ref Range Status   SARS Coronavirus 2 by RT PCR NEGATIVE NEGATIVE Final    Comment: (NOTE) SARS-CoV-2 target nucleic acids are NOT DETECTED.  The SARS-CoV-2 RNA is generally detectable in upper respiratory specimens during the acute phase of infection. The lowest concentration of SARS-CoV-2 viral copies this assay can detect is 138 copies/mL. A negative result does not preclude SARS-Cov-2 infection and should not be used as the sole basis for treatment or other patient management decisions. A negative result may occur with  improper specimen collection/handling, submission of specimen other than nasopharyngeal swab, presence of viral mutation(s) within the areas targeted by this assay, and inadequate number of viral copies(<138 copies/mL). A negative result must be combined with clinical observations, patient history, and epidemiological information. The expected result is Negative.  Fact Sheet for Patients:  EntrepreneurPulse.com.au  Fact Sheet for Healthcare Providers:  IncredibleEmployment.be  This test is no t yet approved or cleared by the [place] FDA and  has been authorized for detection and/or diagnosis of SARS-CoV-2 by FDA under an Emergency Use Authorization (EUA). This EUA will remain  in effect (meaning this test can be used) for the duration of the COVID-19 declaration under Section 564(b)(1) of the Act, 21 U.S.C.section 360bbb-3(b)(1), unless the authorization is terminated  or revoked sooner.       Influenza A by PCR NEGATIVE NEGATIVE Final   Influenza B by PCR NEGATIVE NEGATIVE Final    Comment: (NOTE) The Xpert Xpress SARS-CoV-2/FLU/RSV plus assay is intended as an aid in the  diagnosis of influenza from Nasopharyngeal swab specimens and should not be used as a sole basis for treatment. Nasal washings and aspirates are unacceptable for Xpert Xpress SARS-CoV-2/FLU/RSV testing.  Fact Sheet for Patients: EntrepreneurPulse.com.au  Fact Sheet for Healthcare Providers: IncredibleEmployment.be  This test is not yet approved or cleared by the [place] FDA and has been authorized for detection and/or diagnosis of SARS-CoV-2 by FDA under an Emergency Use Authorization (EUA). This EUA will remain in effect (meaning this test can be used) for the duration of the COVID-19 declaration under Section 564(b)(1) of the Act, 21 U.S.C. section 360bbb-3(b)(1), unless the authorization is terminated or revoked.  Performed at [facility], [place] [address], [place], [place] [address]     Labs: Results for orders placed or performed during the hospital encounter of 02/01/21 (from the past 48 hour(s))  Urinalysis, Routine w reflex microscopic Urine, Clean Catch     Status: Abnormal   Collection Time: 02/01/21 10:31 AM  Result Value Ref Range   Color, Urine AMBER (A) YELLOW    Comment: BIOCHEMICALS MAY BE AFFECTED BY COLOR   APPearance HAZY (A) CLEAR   Specific Gravity, Urine 1.020 1.005 - 1.030   pH 6.0 5.0 -  8.0   Glucose, UA NEGATIVE NEGATIVE mg/dL   Hgb urine dipstick NEGATIVE NEGATIVE   Bilirubin Urine NEGATIVE NEGATIVE   Ketones, ur NEGATIVE NEGATIVE mg/dL   Protein, ur 30 (A) NEGATIVE mg/dL   Nitrite NEGATIVE NEGATIVE   Leukocytes,Ua NEGATIVE NEGATIVE   RBC / HPF 0-5 0 - 5 RBC/hpf   WBC, UA 0-5 0 - 5 WBC/hpf   Bacteria, UA RARE (A) NONE SEEN   Squamous Epithelial / LPF 0-5 0 - 5   Mucus PRESENT     Comment: Performed at [facility], [place] [address], [place], [place] [address]  Resp Panel by RT-PCR (Flu A&B, Covid) Nasopharyngeal Swab     Status: None   Collection Time: 02/01/21 10:58 AM   Specimen:  Nasopharyngeal Swab; Nasopharyngeal(NP) swabs in vial transport medium  Result Value Ref Range   SARS Coronavirus 2 by RT PCR NEGATIVE NEGATIVE    Comment: (NOTE) SARS-CoV-2 target nucleic acids are NOT DETECTED.  The SARS-CoV-2 RNA is generally detectable in upper respiratory specimens during the acute phase of infection. The lowest concentration of SARS-CoV-2 viral copies this assay can detect is 138 copies/mL. A negative result does not preclude SARS-Cov-2 infection and should not be used as the sole basis for treatment or other patient management decisions. A negative result may occur with  improper specimen collection/handling, submission of specimen other than nasopharyngeal swab, presence of viral mutation(s) within the areas targeted by this assay, and inadequate number of viral copies(<138 copies/mL). A negative result must be combined with clinical observations, patient history, and epidemiological information. The expected result is Negative.  Fact Sheet for Patients:  EntrepreneurPulse.com.au  Fact Sheet for Healthcare Providers:  IncredibleEmployment.be  This test is no t yet approved or cleared by the [place] FDA and  has been authorized for detection and/or diagnosis of SARS-CoV-2 by FDA under an Emergency Use Authorization (EUA). This EUA will remain  in effect (meaning this test can be used) for the duration of the COVID-19 declaration under Section 564(b)(1) of the Act, 21 U.S.C.section 360bbb-3(b)(1), unless the authorization is terminated  or revoked sooner.       Influenza A by PCR NEGATIVE NEGATIVE   Influenza B by PCR NEGATIVE NEGATIVE    Comment: (NOTE) The Xpert Xpress SARS-CoV-2/FLU/RSV plus assay is intended as an aid in the diagnosis of influenza from Nasopharyngeal swab specimens and should not be used as a sole basis for treatment. Nasal washings and aspirates are unacceptable for Xpert Xpress  SARS-CoV-2/FLU/RSV testing.  Fact Sheet for Patients: EntrepreneurPulse.com.au  Fact Sheet for Healthcare Providers: IncredibleEmployment.be  This test is not yet approved or cleared by the [place] FDA and has been authorized for detection and/or diagnosis of SARS-CoV-2 by FDA under an Emergency Use Authorization (EUA). This EUA will remain in effect (meaning this test can be used) for the duration of the COVID-19 declaration under Section 564(b)(1) of the Act, 21 U.S.C. section 360bbb-3(b)(1), unless the authorization is terminated or revoked.  Performed at [facility], [place] [address] [place], [place] [address]   Lactic acid, plasma     Status: None   Collection Time: 02/01/21 12:14 PM  Result Value Ref Range   Lactic Acid, Venous 1.4 0.5 - 1.9 mmol/L    Comment: Performed at [facility] [address] [place], [place] [address]  Comprehensive metabolic panel     Status: Abnormal   Collection Time: 02/01/21 12:14 PM  Result Value Ref Range  Sodium 134 (L) 135 - 145 mmol/L   Potassium 3.8 3.5 - 5.1 mmol/L   Chloride 98 98 - 111 mmol/L   CO2 23 22 - 32 mmol/L   Glucose, Bld 108 (H) 70 - 99 mg/dL    Comment: Glucose reference range applies only to samples taken after fasting for at least 8 hours.   BUN 7 6 - 20 mg/dL   Creatinine, Ser 0.83 0.61 - 1.24 mg/dL   Calcium 9.1 8.9 - 10.3 mg/dL   Total Protein 7.7 6.5 - 8.1 g/dL   Albumin 3.2 (L) 3.5 - 5.0 g/dL   AST 33 15 - 41 U/L   ALT 49 (H) 0 - 44 U/L   Alkaline Phosphatase 74 38 - 126 U/L   Total Bilirubin 1.1 0.3 - 1.2 mg/dL   GFR, Estimated >60 >60 mL/min    Comment: (NOTE) Calculated using the CKD-EPI Creatinine Equation (2021)    Anion gap 13 5 - 15    Comment: Performed at [facility], [place] [address] [place], [place] [address]  CBC WITH DIFFERENTIAL     Status: Abnormal   Collection Time: 02/01/21 12:14 PM  Result Value Ref Range   WBC 23.6 (H) 4.0  - 10.5 K/uL   RBC 4.63 4.22 - 5.81 MIL/uL   Hemoglobin 14.4 13.0 - 17.0 g/dL   HCT 41.5 39.0 - 52.0 %   MCV 89.6 80.0 - 100.0 fL   MCH 31.1 26.0 - 34.0 pg   MCHC 34.7 30.0 - 36.0 g/dL   RDW 11.6 11.5 - 15.5 %   Platelets 436 (H) 150 - 400 K/uL   nRBC 0.0 0.0 - 0.2 %   Neutrophils Relative % 84 %   Neutro Abs 19.9 (H) 1.7 - 7.7 K/uL   Lymphocytes Relative 6 %   Lymphs Abs 1.4 0.7 - 4.0 K/uL   Monocytes Relative 8 %   Monocytes Absolute 1.9 (H) 0.1 - 1.0 K/uL   Eosinophils Relative 0 %   Eosinophils Absolute 0.0 0.0 - 0.5 K/uL   Basophils Relative 0 %   Basophils Absolute 0.1 0.0 - 0.1 K/uL   Immature Granulocytes 2 %   Abs Immature Granulocytes 0.36 (H) 0.00 - 0.07 K/uL    Comment: Performed at [facility] [address] [place], [place] [address]  Protime-INR     Status: None   Collection Time: 02/01/21 12:14 PM  Result Value Ref Range   Prothrombin Time 13.7 11.4 - 15.2 seconds   INR 1.1 0.8 - 1.2    Comment: (NOTE) INR goal varies based on device and disease states. Performed at [facility], [place] [address] [place], [place] [address]   APTT     Status: None   Collection Time: 02/01/21 12:14 PM  Result Value Ref Range   aPTT 26 24 - 36 seconds    Comment: Performed at [facility] [address], [place],  [address]  Comprehensive metabolic panel     Status: Abnormal   Collection Time: 02/02/21  1:27 AM  Result Value Ref Range   Sodium 136 135 - 145 mmol/L   Potassium 3.6 3.5 - 5.1 mmol/L   Chloride 101 98 - 111 mmol/L   CO2 25 22 - 32 mmol/L   Glucose, Bld 102 (H) 70 - 99 mg/dL    Comment: Glucose reference range applies only to samples taken after fasting for at least 8 hours.   BUN 6 6 - 20  mg/dL   Creatinine, Ser 0.83 0.61 - 1.24 mg/dL   Calcium 8.4 (L) 8.9 - 10.3 mg/dL   Total Protein 5.8 (L) 6.5 - 8.1 g/dL   Albumin 2.4 (L) 3.5 - 5.0 g/dL   AST 21 15 - 41 U/L   ALT 40 0 - 44 U/L   Alkaline Phosphatase 59 38 - 126 U/L   Total  Bilirubin 1.3 (H) 0.3 - 1.2 mg/dL   GFR, Estimated >60 >60 mL/min    Comment: (NOTE) Calculated using the CKD-EPI Creatinine Equation (2021)    Anion gap 10 5 - 15    Comment: Performed at [facility], [place] [address], [place], [place] [address]  CBC     Status: Abnormal   Collection Time: 02/02/21  1:27 AM  Result Value Ref Range   WBC 18.3 (H) 4.0 - 10.5 K/uL   RBC 3.89 (L) 4.22 - 5.81 MIL/uL   Hemoglobin 11.9 (L) 13.0 - 17.0 g/dL   HCT 35.4 (L) 39.0 - 52.0 %   MCV 91.0 80.0 - 100.0 fL   MCH 30.6 26.0 - 34.0 pg   MCHC 33.6 30.0 - 36.0 g/dL   RDW 11.5 11.5 - 15.5 %   Platelets 346 150 - 400 K/uL   nRBC 0.0 0.0 - 0.2 %    Comment: Performed at [facility], [place] [address], [place], [place] [address]    Imaging / Studies: CT ABDOMEN PELVIS W CONTRAST  Result Date: 02/01/2021 CLINICAL DATA:  Abdominal pain for 2 weeks EXAM: CT ABDOMEN AND PELVIS WITH CONTRAST TECHNIQUE: Multidetector CT imaging of the abdomen and pelvis was performed using the standard protocol following bolus administration of intravenous contrast. Sagittal and coronal MPR images reconstructed from axial data set. CONTRAST:  127mL OMNIPAQUE IOHEXOL 300 MG/ML SOLN IV. No oral contrast. COMPARISON:  None FINDINGS: Lower chest: Lung bases clear Hepatobiliary: Gallbladder and liver normal appearance Pancreas: Normal appearance. Spleen: Normal appearance.  Small splenule anterior to spleen. Adrenals/Urinary Tract: Mild dilatation of RIGHT renal collecting system and proximal RIGHT ureter with decompressed distal RIGHT ureter, likely due to process in RIGHT mid abdomen, see below. Adrenal glands, kidneys, ureters, and bladder otherwise normal appearance. Stomach/Bowel: Large inflammatory process identified medial to the cecum at proximal ascending colon measuring 9.5 x 8.6 x 9.4 cm. Collection contains air, fluid, and surrounding inflammatory changes. A portion of the margin is well-defined enhancing while additional  areas are less well-defined, with scattered edema and soft tissue gas. A single small calcification is seen within the collection 5 mm diameter which could represent an appendicolith. The appendix is not well identified within this collection/area. Distal colonic diverticulosis involving descending and sigmoid colon the inflammatory process does abut the sigmoid colon though only minimal colonic wall thickening is identified. The epicenter of the process appears to be centered medial to the cecum rather than at the sigmoid colon. Findings most favor perforated appendicitis rather than diverticulitis. Vascular/Lymphatic: Vascular structures patent.  No adenopathy. Reproductive: Unremarkable prostate gland and seminal vesicles Other: BILATERAL inguinal hernias containing fat. No free air or free fluid. Tiny umbilical hernia containing fat. Musculoskeletal: Unremarkable IMPRESSION: Large inflammatory process medial to the cecum 9.5 x 8.6 x 9.4 cm in size containing fluid, gas, and surrounding inflammatory changes. This likely represents perforated appendicitis with a large phlegmon and developing abscess though this is not entirely well-circumscribed; while this does abut the sigmoid colon which has evidence of diverticulosis, the epicenter of the inflammatory process is more centered  in the RIGHT mid abdomen is more consistent with perforated appendicitis than perforated diverticulitis. Appendix is poorly localized within this collection. No free air or free fluid. BILATERAL inguinal umbilical hernias containing fat. Findings called to [facility] on 02/01/2021 at 1324 hours. Electronically Signed   By: Lavonia Dana M.D.   On: 02/01/2021 13:29   DG Chest Port 1 View  Result Date: 02/01/2021 CLINICAL DATA:  Sepsis.  Abdominal pain. EXAM: PORTABLE CHEST 1 VIEW COMPARISON:  None FINDINGS: The heart size and mediastinal contours are within normal limits. Both lungs are clear. The visualized skeletal structures  are unremarkable. IMPRESSION: No active disease. Electronically Signed   By: Kerby Moors M.D.   On: 02/01/2021 11:40   DG Abd 2 Views  Result Date: 01/31/2021 CLINICAL DATA:  Low abdominal pain for 1 week. EXAM: ABDOMEN - 2 VIEW COMPARISON:  None FINDINGS: The bowel gas pattern is normal. There is no evidence of free air. No radio-opaque calculi or other significant radiographic abnormality is seen. IMPRESSION: Negative. Electronically Signed   By: Kerby Moors M.D.   On: 01/31/2021 11:52    Medications / Allergies: per chart  Antibiotics: Anti-infectives (From admission, onward)   Start     Dose/Rate Route Frequency Ordered Stop   02/01/21 2200  vancomycin (VANCOREADY) IVPB 750 mg/150 mL        750 mg 150 mL/hr over 60 Minutes Intravenous Every 8 hours 02/01/21 1542     02/01/21 1600  piperacillin-tazobactam (ZOSYN) IVPB 3.375 g        3.375 g 12.5 mL/hr over 240 Minutes Intravenous Every 8 hours 02/01/21 1517     02/01/21 1145  vancomycin (VANCOREADY) IVPB 1750 mg/350 mL        1,750 mg 175 mL/hr over 120 Minutes Intravenous  Once 02/01/21 1059 02/01/21 1457   02/01/21 1100  ceFEPIme (MAXIPIME) 2 g in sodium chloride 0.9 % 100 mL IVPB        2 g 200 mL/hr over 30 Minutes Intravenous  Once 02/01/21 1059 02/01/21 1226   02/01/21 1100  metroNIDAZOLE (FLAGYL) IVPB 500 mg        500 mg 100 mL/hr over 60 Minutes Intravenous  Once 02/01/21 1059 02/01/21 1328        Note: Portions of this report may have been transcribed using voice recognition software. Every effort was made to ensure accuracy; however, inadvertent computerized transcription errors may be present.   Any transcriptional errors that result from this process are unintentional.    Adin Hector, MD, FACS, MASCRS Gastrointestinal and Minimally Invasive Surgery  [facility] [address], [place], [place] [address] [phone] Fax [phone] Main/Paging  CONTACT  INFORMATION: Weekday (9AM-5PM) concerns: Call [facility] main office at [phone] Weeknight (5PM-9AM) or Weekend/Holiday concerns: Check [email or web address] for General Surgery [facility] coverage (Please, do not use SecureChat as it is not reliable communication to operating surgeons for immediate patient care)      02/02/2021  10:18 AM

## 2021-02-03 ENCOUNTER — Inpatient Hospital Stay (HOSPITAL_COMMUNITY): Payer: Managed Care, Other (non HMO)

## 2021-02-03 LAB — CBC
HCT: 37 % — ABNORMAL LOW (ref 39.0–52.0)
Hemoglobin: 12.2 g/dL — ABNORMAL LOW (ref 13.0–17.0)
MCH: 30.2 pg (ref 26.0–34.0)
MCHC: 33 g/dL (ref 30.0–36.0)
MCV: 91.6 fL (ref 80.0–100.0)
Platelets: 392 10*3/uL (ref 150–400)
RBC: 4.04 MIL/uL — ABNORMAL LOW (ref 4.22–5.81)
RDW: 11.4 % — ABNORMAL LOW (ref 11.5–15.5)
WBC: 14.6 10*3/uL — ABNORMAL HIGH (ref 4.0–10.5)
nRBC: 0 % (ref 0.0–0.2)

## 2021-02-03 LAB — BASIC METABOLIC PANEL
Anion gap: 10 (ref 5–15)
BUN: 5 mg/dL — ABNORMAL LOW (ref 6–20)
CO2: 24 mmol/L (ref 22–32)
Calcium: 8.2 mg/dL — ABNORMAL LOW (ref 8.9–10.3)
Chloride: 101 mmol/L (ref 98–111)
Creatinine, Ser: 0.82 mg/dL (ref 0.61–1.24)
GFR, Estimated: >60 mL/min (ref >60)
Glucose, Bld: 110 mg/dL — ABNORMAL HIGH (ref 70–99)
Potassium: 3.3 mmol/L — ABNORMAL LOW (ref 3.5–5.1)
Sodium: 135 mmol/L (ref 135–145)

## 2021-02-03 LAB — PROTIME-INR
INR: 1.1 (ref 0.8–1.2)
Prothrombin Time: 13.7 seconds (ref 11.4–15.2)

## 2021-02-03 LAB — MAGNESIUM: Magnesium: 2 mg/dL (ref 1.7–2.4)

## 2021-02-03 MED ORDER — FENTANYL CITRATE (PF) 100 MCG/2ML IJ SOLN
INTRAMUSCULAR | Status: AC | PRN
  Administered 2021-02-03 (×2): 25 ug via INTRAVENOUS
  Administered 2021-02-03: 50 ug via INTRAVENOUS

## 2021-02-03 MED ORDER — SODIUM CHLORIDE 0.9% FLUSH
5.0000 mL | Freq: Three times a day (TID) | INTRAVENOUS | Status: DC
  Administered 2021-02-03 – 2021-02-04 (×2): 5 mL

## 2021-02-03 MED ORDER — SODIUM CHLORIDE 0.9 % IV SOLN
INTRAVENOUS | Status: AC | PRN
  Administered 2021-02-03: 10 mL/h via INTRAVENOUS

## 2021-02-03 MED ORDER — FENTANYL CITRATE (PF) 100 MCG/2ML IJ SOLN
INTRAMUSCULAR | Status: AC
  Filled 2021-02-03: qty 2

## 2021-02-03 MED ORDER — MIDAZOLAM HCL 2 MG/2ML IJ SOLN
INTRAMUSCULAR | Status: AC
  Filled 2021-02-03: qty 2

## 2021-02-03 MED ORDER — LIDOCAINE HCL 1 % IJ SOLN
INTRAMUSCULAR | Status: AC
  Filled 2021-02-03: qty 20

## 2021-02-03 MED ORDER — MIDAZOLAM HCL 2 MG/2ML IJ SOLN
INTRAMUSCULAR | Status: AC | PRN
  Administered 2021-02-03 (×2): 0.5 mg via INTRAVENOUS
  Administered 2021-02-03: 1 mg via INTRAVENOUS

## 2021-02-03 NOTE — Progress Notes (Signed)
[facility] Progress Note     Subjective: CC:  Abdominal pain improving. Having multiple liquid, non-bloody stools daily. Denies nausea or vomiting. Got his perc drain this AM in IR.   Objective: Vital signs in last 24 hours: Temp:  [98.5 F (36.9 C)-99.5 F (37.5 C)] 98.9 F (37.2 C) (02/21 0500) Pulse Rate:  [71-90] 73 (02/21 0900) Resp:  [12-19] 19 (02/21 0900) BP: (111-141)/(70-96) 128/89 (02/21 0900) SpO2:  [99 %-100 %] 99 % (02/21 0900) Last BM Date: 02/01/21  Intake/Output from previous day: 02/20 0701 - 02/21 0700 In: 2500 [P.O.:1200; I.V.:950; IV Piggyback:350] Out: -  Intake/Output this shift: Total I/O In: -  Out: 80 [Drains:80]  PE: Gen:  Alert, NAD, pleasant Card:  Regular rate and rhythm, pedal pulses 2+ BL Pulm:  Normal effort, clear to auscultation bilaterally Abd: Soft, mild RLQ tenderness without guarding or peritonitis, R RP perc drain with SS and purulent drainage in bulb. +BS Skin: warm and dry, no rashes  Psych: A&Ox3   Lab Results:  Recent Labs    02/02/21 0127 02/03/21 0132  WBC 18.3* 14.6*  HGB 11.9* 12.2*  HCT 35.4* 37.0*  PLT 346 392   BMET Recent Labs    02/02/21 0127 02/03/21 0132  NA 136 135  K 3.6 3.3*  CL 101 101  CO2 25 24  GLUCOSE 102* 110*  BUN 6 5*  CREATININE 0.83 0.82  CALCIUM 8.4* 8.2*   PT/INR Recent Labs    02/01/21 1214 02/03/21 0132  LABPROT 13.7 13.7  INR 1.1 1.1   CMP     Component Value Date/Time   NA 135 02/03/2021 0132   K 3.3 (L) 02/03/2021 0132   CL 101 02/03/2021 0132   CO2 24 02/03/2021 0132   GLUCOSE 110 (H) 02/03/2021 0132   BUN 5 (L) 02/03/2021 0132   CREATININE 0.82 02/03/2021 0132   CALCIUM 8.2 (L) 02/03/2021 0132   PROT 5.8 (L) 02/02/2021 0127   ALBUMIN 2.4 (L) 02/02/2021 0127   AST 21 02/02/2021 0127   ALT 40 02/02/2021 0127   ALKPHOS 59 02/02/2021 0127   BILITOT 1.3 (H) 02/02/2021 0127   GFRNONAA >60 02/03/2021 0132   Lipase     Component Value Date/Time    LIPASE 30 01/31/2021 1158       Studies/Results: CT ABDOMEN PELVIS W CONTRAST  Result Date: 02/01/2021 CLINICAL DATA:  Abdominal pain for 2 weeks EXAM: CT ABDOMEN AND PELVIS WITH CONTRAST TECHNIQUE: Multidetector CT imaging of the abdomen and pelvis was performed using the standard protocol following bolus administration of intravenous contrast. Sagittal and coronal MPR images reconstructed from axial data set. CONTRAST:  172mL OMNIPAQUE IOHEXOL 300 MG/ML SOLN IV. No oral contrast. COMPARISON:  None FINDINGS: Lower chest: Lung bases clear Hepatobiliary: Gallbladder and liver normal appearance Pancreas: Normal appearance. Spleen: Normal appearance.  Small splenule anterior to spleen. Adrenals/Urinary Tract: Mild dilatation of RIGHT renal collecting system and proximal RIGHT ureter with decompressed distal RIGHT ureter, likely due to process in RIGHT mid abdomen, see below. Adrenal glands, kidneys, ureters, and bladder otherwise normal appearance. Stomach/Bowel: Large inflammatory process identified medial to the cecum at proximal ascending colon measuring 9.5 x 8.6 x 9.4 cm. Collection contains air, fluid, and surrounding inflammatory changes. A portion of the margin is well-defined enhancing while additional areas are less well-defined, with scattered edema and soft tissue gas. A single small calcification is seen within the collection 5 mm diameter which could represent an appendicolith. The appendix is not well  identified within this collection/area. Distal colonic diverticulosis involving descending and sigmoid colon the inflammatory process does abut the sigmoid colon though only minimal colonic wall thickening is identified. The epicenter of the process appears to be centered medial to the cecum rather than at the sigmoid colon. Findings most favor perforated appendicitis rather than diverticulitis. Vascular/Lymphatic: Vascular structures patent.  No adenopathy. Reproductive: Unremarkable prostate  gland and seminal vesicles Other: BILATERAL inguinal hernias containing fat. No free air or free fluid. Tiny umbilical hernia containing fat. Musculoskeletal: Unremarkable IMPRESSION: Large inflammatory process medial to the cecum 9.5 x 8.6 x 9.4 cm in size containing fluid, gas, and surrounding inflammatory changes. This likely represents perforated appendicitis with a large phlegmon and developing abscess though this is not entirely well-circumscribed; while this does abut the sigmoid colon which has evidence of diverticulosis, the epicenter of the inflammatory process is more centered in the RIGHT mid abdomen is more consistent with perforated appendicitis than perforated diverticulitis. Appendix is poorly localized within this collection. No free air or free fluid. BILATERAL inguinal umbilical hernias containing fat. Findings called to [facility] on 02/01/2021 at 1324 hours. Electronically Signed   By: Lavonia Dana M.D.   On: 02/01/2021 13:29   DG Chest Port 1 View  Result Date: 02/01/2021 CLINICAL DATA:  Sepsis.  Abdominal pain. EXAM: PORTABLE CHEST 1 VIEW COMPARISON:  None FINDINGS: The heart size and mediastinal contours are within normal limits. Both lungs are clear. The visualized skeletal structures are unremarkable. IMPRESSION: No active disease. Electronically Signed   By: Kerby Moors M.D.   On: 02/01/2021 11:40   CT IMAGE GUIDED DRAINAGE BY PERCUTANEOUS CATHETER  Result Date: 02/03/2021 INDICATION: 50 year old male with presumed ruptured appendicitis with associated intra-abdominal abscess. EXAM: CT IMAGE GUIDED DRAINAGE BY PERCUTANEOUS CATHETER COMPARISON:  02/01/2021 MEDICATIONS: The patient is currently admitted to the hospital and receiving intravenous antibiotics. The antibiotics were administered within an appropriate time frame prior to the initiation of the procedure. ANESTHESIA/SEDATION: Moderate (conscious) sedation was employed during this procedure. A total of Versed 2 mg  and Fentanyl 100 mcg was administered intravenously. Moderate Sedation Time: 21 minutes. The patient's level of consciousness and vital signs were monitored continuously by radiology nursing throughout the procedure under my direct supervision. CONTRAST:  None COMPLICATIONS: None immediate. PROCEDURE: Informed written consent was obtained from the patient after a discussion of the risks, benefits and alternatives to treatment. The patient was placed in the partial left lateral decubitus position on the CT gantry and a pre procedural CT was performed re-demonstrating the known abscess/fluid collection within the right lower quadrant. The procedure was planned. A timeout was performed prior to the initiation of the procedure. The right flank was prepped and draped in the usual sterile fashion. The overlying soft tissues were anesthetized with 1% lidocaine with epinephrine. Appropriate trajectory was planned with the use of a 22 gauge spinal needle. An 18 gauge trocar needle was advanced into the abscess/fluid collection and a short Amplatz super stiff wire was coiled within the collection. Appropriate positioning was confirmed with a limited CT scan. The tract was serially dilated allowing placement of a 12 [place] all-purpose drainage catheter. Appropriate positioning was confirmed with a limited postprocedural CT scan. Approximately 100 ml of purulent fluid was aspirated. The tube was connected to bulb suction and sutured in place. A dressing was placed. The patient tolerated the procedure well without immediate post procedural complication. IMPRESSION: Successful CT guided placement of a 74 French all purpose drain catheter into  the right lower quadrant with aspiration of approximately 100 mL of purulent fluid. Samples were sent to the laboratory as requested by the ordering clinical team. Ruthann Cancer, MD [facility] Electronically Signed   By: Ruthann Cancer MD   On: 02/03/2021 09:59    Anti-infectives: Anti-infectives (From admission, onward)   Start     Dose/Rate Route Frequency Ordered Stop   02/01/21 2200  vancomycin (VANCOREADY) IVPB 750 mg/150 mL        750 mg 150 mL/hr over 60 Minutes Intravenous Every 8 hours 02/01/21 1542     02/01/21 1600  piperacillin-tazobactam (ZOSYN) IVPB 3.375 g        3.375 g 12.5 mL/hr over 240 Minutes Intravenous Every 8 hours 02/01/21 1517     02/01/21 1145  vancomycin (VANCOREADY) IVPB 1750 mg/350 mL        1,750 mg 175 mL/hr over 120 Minutes Intravenous  Once 02/01/21 1059 02/01/21 1457   02/01/21 1100  ceFEPIme (MAXIPIME) 2 g in sodium chloride 0.9 % 100 mL IVPB        2 g 200 mL/hr over 30 Minutes Intravenous  Once 02/01/21 1059 02/01/21 1226   02/01/21 1100  metroNIDAZOLE (FLAGYL) IVPB 500 mg        500 mg 100 mL/hr over 60 Minutes Intravenous  Once 02/01/21 1059 02/01/21 1328     Assessment/Plan Acute appendicitis with abscess - afebrile, WBC 14.6 from 18.3 - s/p IR drain 2/21, follow cultures  - continue IV abx - allow FLD and monitor  - AM labs   FEN: FLD, saline lock IV ID: Zosyn 2/19 >>  VTE: SCD's, Lovenox  Foley: none Dispo: IV abx for perforated appendicitis, monitor JP drain    LOS: 2 days    Obie Dredge, [facility] Please see Amion for pager number during day hours 7:00am-4:30pm

## 2021-02-03 NOTE — Plan of Care (Signed)
  Problem: Education: Goal: Knowledge of General Education information will improve Description Including pain rating scale, medication(s)/side effects and non-pharmacologic comfort measures Outcome: Progressing   

## 2021-02-03 NOTE — Procedures (Signed)
Interventional Radiology Procedure Note  Procedure: CT guided right lower quadrant abdominal drain placement  Findings: Please refer to procedural dictation for full description. Persistent RLQ peri-cecal abscess.  12 Fr pigtail drain placed from retroperitoneal approach.  Approximately 100 mL sanguino-purulent aspirate.  Sample sent for culture.  Complications: None immediate  Estimated Blood Loss: < 5 mL  Recommendations: Keep to bulb suction for now. IR will continue to follow.   Ruthann Cancer, MD

## 2021-02-04 ENCOUNTER — Other Ambulatory Visit: Payer: Self-pay | Admitting: Radiology

## 2021-02-04 DIAGNOSIS — K3533 Acute appendicitis with perforation and localized peritonitis, with abscess: Secondary | ICD-10-CM

## 2021-02-04 LAB — CBC
HCT: 35.6 % — ABNORMAL LOW (ref 39.0–52.0)
Hemoglobin: 12.3 g/dL — ABNORMAL LOW (ref 13.0–17.0)
MCH: 31.5 pg (ref 26.0–34.0)
MCHC: 34.6 g/dL (ref 30.0–36.0)
MCV: 91.3 fL (ref 80.0–100.0)
Platelets: 436 10*3/uL — ABNORMAL HIGH (ref 150–400)
RBC: 3.9 MIL/uL — ABNORMAL LOW (ref 4.22–5.81)
RDW: 11.7 % (ref 11.5–15.5)
WBC: 9.9 10*3/uL (ref 4.0–10.5)
nRBC: 0 % (ref 0.0–0.2)

## 2021-02-04 LAB — URINE CULTURE: Culture: 300 — AB

## 2021-02-04 MED ORDER — AMOXICILLIN-POT CLAVULANATE 875-125 MG PO TABS: 1.0000 | ORAL_TABLET | Freq: Two times a day (BID) | ORAL | 0 refills | Status: AC

## 2021-02-04 MED ORDER — AMOXICILLIN-POT CLAVULANATE 875-125 MG PO TABS
1.0000 | ORAL_TABLET | Freq: Two times a day (BID) | ORAL | Status: DC
  Administered 2021-02-04: 1 via ORAL
  Filled 2021-02-04: qty 1

## 2021-02-04 MED ORDER — SODIUM CHLORIDE 0.9% FLUSH: 10.0000 mL | Freq: Three times a day (TID) | INTRAVENOUS | Status: DC

## 2021-02-04 MED ORDER — ACETAMINOPHEN 325 MG PO TABS: 650.0000 mg | ORAL_TABLET | Freq: Four times a day (QID) | ORAL | Status: DC | PRN

## 2021-02-04 MED ORDER — POTASSIUM CHLORIDE CRYS ER 20 MEQ PO TBCR
40.0000 meq | EXTENDED_RELEASE_TABLET | Freq: Once | ORAL | Status: AC
  Administered 2021-02-04: 40 meq via ORAL
  Filled 2021-02-04: qty 2

## 2021-02-04 NOTE — Plan of Care (Signed)
  Problem: Activity: Goal: Risk for activity intolerance will decrease Outcome: Progressing   Problem: Nutrition: Goal: Adequate nutrition will be maintained Outcome: Progressing   Problem: Pain Managment: Goal: General experience of comfort will improve Outcome: Progressing   

## 2021-02-04 NOTE — Discharge Instructions (Signed)
Low-Fiber Eating Plan Fiber is found in fruits, vegetables, whole grains, and beans. Eating a diet low in fiber helps to reduce how often you have bowel movements and the amount of stool you produce. A low-fiber eating plan may help your digestive system heal if you:  Have certain conditions, such as Crohn's disease, diverticulitis, or irritable bowel syndrome (IBS), and are having a flare-up.  Recently had radiation therapy on your pelvis or bowel.  Recently had intestinal surgery.  Have a new surgical opening in your abdomen (colostomy or ileostomy).  Have an intestine that has narrowed (stricture). Your health care provider will tell you how long to stay on this diet and may recommend that you work with a dietitian. What are tips for following this plan? Reading food labels  Check the nutrition facts label on food products for the amount of dietary fiber.  Choose foods that have less than 2 grams (g) of fiber per serving.   Cooking  Use white flour for baking and cooking.  Cook meat using methods that keep it tender, such as braising or poaching.  Cook eggs until the yolk is completely solid.  Cook with healthy oils, such as olive oil or canola oil. Meal planning  Eat 5-6 small meals throughout the day instead of 3 large meals.  If you are lactose intolerant: ? Choose low-lactose dairy foods. ? Do not eat dairy foods if told by your health care provider or dietitian.  Limit fats and oils to less than 8 teaspoons (39 mL) a day.  Eat small portions of desserts.  Limit acidic, spicy, or fried foods to reduce gas, bloating, and discomfort. General information  Follow instructions from your health care provider or dietitian about how much fiber you should have each day.  Most people on a low-fiber eating plan should eat less than 10 g of fiber a day. Your daily fiber goal is _________________ g.  Take vitamin and mineral supplements as told by your health care provider or  dietitian. Chewable or liquid forms are best when on this eating plan. A gummy vitamin is not recommended. What foods should I eat? Fruits Soft-cooked or canned fruits without skin and seeds. Ripe banana. Applesauce. Fruit juice without pulp. Vegetables Well-cooked or canned vegetables without skin, seeds, or stems. Cooked potatoes without skins. Vegetable juice. Grains All bread and crackers made with white flour. Waffles, pancakes, and [place] toast. Bagels. Pretzels. Melba toast, zwieback, and matzoh. Cooked and dried cereals that do not have whole grains, added fiber, seeds, or dried fruit. Domenick Gong. Hot and cold cereals made with refined corn, rice, or oats. Plain pasta and noodles. White rice. Meats and other proteins Ground meat. Tender cuts of meat or poultry. Eggs. Fish, seafood, and shellfish. Smooth nut butters. Tofu. Dairy All milk products and drinks. Lactose-free milk, including rice, soy, and almond milk. Yogurt without fruit, nuts, chocolate, or granola mixed in. Sour cream. Cottage cheese. Cheese. Fats and oils Olive oil, canola oil, sunflower oil, flaxseed oil, avocado oil, and grapeseed oil. Mayonnaise. Cream cheese. Margarine. Butter. Beverages Decaf coffee. Fruit and vegetable juices. Smoothies (in small amounts, with no pulp or skins, and with fruits from the recommended list). Sports drinks. Herbal tea. Water. Sweets and desserts Plain cakes. Cookies. Cream pies and pies made with recommended fruits. Pudding. Custard. Fruit gelatin. Sherbet. Ice pops. Ice cream without nuts. Hard candy. Honey. Jelly. Molasses. Syrups. Chocolate. Marshmallows. Gumdrops. Seasonings and condiments Ketchup. Mild mustard. Mild salad dressings. Plain gravies. Vinegar. Spices in  moderation. Salt. Sugar. Other foods Bouillon. Broth. Cream and strained soups made from recommended foods. Casseroles made with recommended foods. The items listed above may not be a complete list of foods and beverages  you can eat. Contact a dietitian for more information. What foods should I avoid? Fruits Raw or dried fruit. Berries. Fruit juice with pulp. Prune juice. Vegetables Potato skins. Raw or undercooked vegetables. All beans and bean sprouts. Cooked greens. Corn. Peas. Cabbage. Beets. Broccoli. Brussels sprouts. Cauliflower. Mushrooms. Onions. Peppers. Parsnips. Okra. Sauerkraut. Grains Whole-wheat, whole-grain, or multigrain breads, cereals, or crackers. Rye bread. Cereals with nuts, raisins, or coconut. Bran. Granola. High-fiber cereals. Cornmeal or corn bread. Whole-grain pasta. Wild or brown rice. Quinoa. Popcorn. Buckwheat. Wheat germ. Meats and other proteins Tough, fibrous meats with gristle. Fatty meat. Poultry with skin. Fried meat, Sales executive, or fish. Precooked or cured meat, such as sausages or meat loaves. Berniece Salines. Hot dogs. Nuts and chunky nut butter. Dried peas, beans, and lentils. Hummus. Dairy Yogurt with fruit, nuts, chocolate, or granola mixed in. Full-fat dairy such as whole milk, ice cream, or sour cream. Beverages Caffeinated coffee and teas. Fats and oils Avocado. Coconut. Butter. Sweets and desserts Desserts, cookies, or candies that contain nuts or coconut. Dried fruit. Jams and preserves with seeds. Marmalade. Any dessert made with fruits or grains that are not recommended. Seasonings and condiments Relish. Horseradish. Angie Fava. Olives. Other foods Corn tortilla chips. Soups made with vegetables or grains that are not recommended. The items listed above may not be a complete list of foods and beverages you should avoid. Contact a dietitian for more information. Summary  Most people on a low-fiber eating plan should eat less than 10 grams of fiber a day. Follow recommendations from your health care provider or dietitian about how much fiber you should have each day.  Always check nutrition facts labels to see the dietary fiber amount in packaged foods. A low-fiber food will  have less than 2 grams of fiber per serving.  Try to avoid whole grains, raw fruits and vegetables, dried fruit, tough cuts of meat, nuts, and seeds.  Take a vitamin and mineral supplement as told by your health care provider or dietitian. This information is not intended to replace advice given to you by your health care provider. Make sure you discuss any questions you have with your health care provider. Document Revised: 04/04/2020 Document Reviewed: 04/04/2020 Elsevier Patient Education  2021 [place].   [email or web address].[email or web address]">  Percutaneous Abscess Drain Placement, Care After This sheet gives you information about how to care for yourself after your procedure. Your health care provider may also give you more specific instructions. If you have problems or questions, contact your health care provider. What can I expect after the procedure? After the procedure, it is common to have:  A small amount of bruising and discomfort in the area where the drainage tube (catheter) was placed.  Sleepiness and fatigue. This should go away after the medicines you were given have worn off. Follow these instructions at home: Incision care  Follow instructions from your health care provider about how to take care of your incision. Make sure you: ? Wash your hands with soap and water for at least 20 seconds before and after you change your bandage (dressing). If soap and water are not available, use hand sanitizer. ? Change your dressing as told by your health care provider. ? Leave stitches (sutures), skin glue, or adhesive strips in place. These skin closures may need to  stay in place for 2 weeks or longer. If adhesive strip edges start to loosen and curl up, you may trim the loose edges. Do not remove adhesive strips completely unless your health care provider tells you to do that.  Check your incision area every day for signs of infection. Check for: ? More redness, swelling, or  pain. ? More fluid or blood. ? Warmth. ? Pus or a bad smell.   Catheter care  Follow instructions from your health care provider about emptying and cleaning your catheter and collection bag or drainage bulb. You may need to clean the catheter every day so it does not clog.  If directed, write down the following information every time you empty your bag: ? The date and time. ? The amount of drainage.  Check for fluid leaking from around your catheter (instead of fluid draining through your catheter). This may be a sign that the drain is no longer working correctly. Activity  Rest at home for 1-2 days after your procedure.  Return to your normal activities as told by your health care provider. Ask your health care provider what activities are safe for you.  If you were given a sedative during the procedure, it can affect you for several hours. Do not drive or operate machinery until your health care provider says that it is safe. General instructions  Take over-the-counter and prescription medicines only as told by your health care provider.  If you were prescribed an antibiotic medicine, take it as told by your health care provider. Do not stop using the antibiotic even if you start to feel better.  Do not take showers, take baths, swim, or use a hot tub for 24 hours after your procedure or until your health care provider says that this is okay.  Do not use any products that contain nicotine or tobacco, such as cigarettes, e-cigarettes, and chewing tobacco. If you need help quitting, ask your health care provider.  Keep all follow-up visits as told by your health care provider. This is important. Contact a health care provider if:  You have less than 10 mL of drainage a day for 2-3 days in a row, or as directed by your health care provider.  You have any of these signs of infection: ? More redness, swelling, or pain around your incision area. ? More fluid or blood coming from your  incision area. ? Warmth coming from your incision area. ? Pus or a bad smell coming from your incision area.  You have fluid leaking from around your catheter (instead of through your catheter).  You have a fever or chills.  You have pain that does not get better with medicine. Get help right away if:  Your catheter comes out.  You suddenly stop having drainage from your catheter.  You suddenly have blood in the fluid that is draining from your catheter.  You become dizzy or you faint.  You develop a rash.  You have nausea or vomiting.  You have difficulty breathing or you feel short of breath.  You develop chest pain.  You have problems with your speech or vision.  You have trouble balancing or moving your arms or legs. Summary  It is common to have a small amount of bruising and discomfort in the area where the drainage tube (catheter) was placed.  Follow instructions from your health care provider about emptying and cleaning your catheter and collection bag or drainage bulb.  You may be directed to record  the amount of drainage from the bag every time you empty it.  Contact a health care provider if you have more redness, swelling, or pain around your incision area or if you have pain that does not get better with medicine. This information is not intended to replace advice given to you by your health care provider. Make sure you discuss any questions you have with your health care provider. Document Revised: 11/25/2019 Document Reviewed: 11/25/2019 Elsevier Patient Education  2021 [place].   Appendicitis, Adult  The appendix is a tube in the body that is shaped like a finger. It is attached to the large intestine. Appendicitis means that this tube is swollen (inflamed). If this is not treated, the tube can tear (rupture). This can lead to a life-threatening infection. This condition can also cause pus to build up in the appendix (abscess). What are the  causes? This condition may be caused by something that blocks the appendix. These include:  A ball of poop (stool).  Lymph glands that are bigger than normal. Sometimes the cause is not known. What increases the risk? You are more likely to develop this condition if you are between 82 and 71 years of age. What are the signs or symptoms? Symptoms of this condition include:  Pain around the belly button. ? The pain moves toward the lower right belly (abdomen). ? The pain can get worse with time. ? The pain can get worse if you cough. ? The pain can get worse if you move suddenly.  Tenderness in the lower right belly.  Feeling sick to your stomach (nauseous).  Vomiting.  Not feeling hungry (loss of appetite).  A fever.  Having trouble pooping (constipation).  Watery poop (diarrhea).  Not feeling well. How is this treated? Sometimes, this condition is treated with antibiotic medicines alone. But it is most often treated with both antibiotics and surgery to take out the appendix (appendectomy). If only antibiotics are given and the appendix is not taken out, there is a chance that the condition could come back. There are two ways to take out the appendix:  Open surgery. For this method, the appendix is taken out through a large cut (incision). The cut is made in the lower right belly. This surgery may be used if: ? You have scars from another surgery. ? You have a bleeding condition. ? You are pregnant and will be having your baby soon. ? You have a condition that makes it hard to do the other type of surgery.  Laparoscopic surgery. For this method, the appendix is taken out through small cuts. Often, this surgery: ? Causes less pain. ? Causes fewer problems. ? Heals faster. If your appendix tears and pus forms:  A drain may be put into the sore. The drain will be used to get rid of the pus.  You may get an antibiotic through an IV tube.  Your appendix may or may not  need to be taken out. Follow these instructions at home: If you had surgery, follow instructions from your doctor on how to:  Care for yourself at home.  Take care of your cut from surgery. Medicines  Take over-the-counter and prescription medicines only as told by your doctor.  If you were prescribed an antibiotic, take it as told by your doctor. Do not stop taking it even if you start to feel better. Eating and drinking Follow instructions from your doctor about what you cannot eat or drink. You may go back  to your diet slowly if:  You no longer feel sick to your stomach.  You have stopped vomiting. General instructions  Do not use any products that contain nicotine or tobacco, such as cigarettes, e-cigarettes, and chewing tobacco. If you need help quitting, ask your doctor.  Do not drive or use heavy machinery while taking prescription pain medicine.  Ask your doctor if the medicine you are taking can cause trouble pooping. You may need to take steps to prevent or treat trouble pooping: ? Drink enough fluid to keep your pee (urine) pale yellow. ? Take over-the-counter or prescription medicines. ? Eat foods that are high in fiber. These include beans, whole grains, and fresh fruits and vegetables. ? Limit foods that are high in fat and sugar. These include fried or sweet foods.  Keep all follow-up visits as told by your doctor. This is important. Contact a doctor if:  There is pus, blood, or a lot of fluid coming from your cut or cuts from surgery.  You are sick to your stomach or you vomit. Get help right away if:  You have pain in your belly, and the pain is getting worse.  You have a fever.  You have chills.  You are very tired.  You have muscle pain.  You are short of breath. Summary  Appendicitis is swelling of the appendix. The appendix is a tube that is shaped like a finger. It is joined to the large intestine.  This condition may be caused by something  that blocks the appendix. This can lead to an infection.  This condition is most often treated with both antibiotic medicines and taking out the appendix. This information is not intended to replace advice given to you by your health care provider. Make sure you discuss any questions you have with your health care provider. Document Revised: 02/26/2020 Document Reviewed: 05/18/2018 Elsevier Patient Education  [place].

## 2021-02-04 NOTE — Progress Notes (Signed)
Referring Physician(s): Dr. Johney Maine  Supervising Physician: Sandi Mariscal  Patient Status:  [facility] - In-pt  Chief Complaint:  Abdominal pain s/p perforated appendix with abscess drain placed by Dr. Serafina Royals on 2.21.22  Subjective:  Currently without any significant complaints. Patient alert and laying in bed, calm and comfortable. Denies any fevers, headache, chest pain, SOB, cough, abdominal pain, nausea, vomiting or bleeding. Anticipated discharge today 2.22.22. Answered patient and patient's wife questions regarding drain and drain care. Floor RN to reinforce education.     Allergies: Patient has no known allergies.  Medications: Prior to Admission medications   Medication Sig Start Date End Date Taking? Authorizing Provider  acetaminophen (TYLENOL) 325 MG tablet Take 2 tablets (650 mg total) by mouth every 6 (six) hours as needed. 02/04/21  Yes Jill Alexanders, PA-C  Ascorbic Acid (VITAMIN C PO) Take 1 tablet by mouth daily.   Yes [provider]  dicyclomine (BENTYL) 20 MG tablet Take 1 tablet (20 mg total) by mouth 2 (two) times daily as needed for spasms (and abdominal pain). 01/31/21  Yes Amyot, Nicholes Stairs, NP  FIBER PO Take 1 tablet by mouth daily.   Yes [provider]  ibuprofen (ADVIL) 200 MG tablet Take 200 mg by mouth every 6 (six) hours as needed for mild pain or headache.   Yes [provider]  Multiple Vitamin (MULTIVITAMIN WITH MINERALS) TABS tablet Take 1 tablet by mouth daily.   Yes [provider]  amoxicillin-clavulanate (AUGMENTIN) 875-125 MG tablet Take 1 tablet by mouth every 12 (twelve) hours for 10 days. 02/04/21 02/14/21  Jill Alexanders, PA-C     Vital Signs: BP 116/82 (BP Location: Left Arm)   Pulse 74   Temp 98.1 F (36.7 C) (Oral)   Resp 18   Ht 5\' 9"  (1.753 m)   Wt 204 lb (92.5 kg)   SpO2 98%   BMI 30.13 kg/m   Physical Exam Vitals and nursing note reviewed.  Constitutional:      Appearance: He is  well-developed and well-nourished.  HENT:     Head: Normocephalic.  Pulmonary:     Effort: Pulmonary effort is normal.  Abdominal:     Comments:  Positive RLQ drain to suction. Site is unremarkable with no erythema, edema, tenderness, bleeding or drainage noted at exit site. Suture and stat lock in place. Dressing is clean dry and intact. 5 ml of  purulent colored fluid noted in  bulb suction device. Per RN drain is able to be flushed easily.   Musculoskeletal:        General: Normal range of motion.     Cervical back: Normal range of motion.  Skin:    General: Skin is dry.  Neurological:     Mental Status: He is alert and oriented to person, place, and time.  Psychiatric:        Mood and Affect: Mood and affect normal.     Imaging: CT ABDOMEN PELVIS W CONTRAST  Result Date: 02/01/2021 CLINICAL DATA:  Abdominal pain for 2 weeks EXAM: CT ABDOMEN AND PELVIS WITH CONTRAST TECHNIQUE: Multidetector CT imaging of the abdomen and pelvis was performed using the standard protocol following bolus administration of intravenous contrast. Sagittal and coronal MPR images reconstructed from axial data set. CONTRAST:  144mL OMNIPAQUE IOHEXOL 300 MG/ML SOLN IV. No oral contrast. COMPARISON:  None FINDINGS: Lower chest: Lung bases clear Hepatobiliary: Gallbladder and liver normal appearance Pancreas: Normal appearance. Spleen: Normal appearance.  Small splenule anterior to  spleen. Adrenals/Urinary Tract: Mild dilatation of RIGHT renal collecting system and proximal RIGHT ureter with decompressed distal RIGHT ureter, likely due to process in RIGHT mid abdomen, see below. Adrenal glands, kidneys, ureters, and bladder otherwise normal appearance. Stomach/Bowel: Large inflammatory process identified medial to the cecum at proximal ascending colon measuring 9.5 x 8.6 x 9.4 cm. Collection contains air, fluid, and surrounding inflammatory changes. A portion of the margin is well-defined enhancing while additional  areas are less well-defined, with scattered edema and soft tissue gas. A single small calcification is seen within the collection 5 mm diameter which could represent an appendicolith. The appendix is not well identified within this collection/area. Distal colonic diverticulosis involving descending and sigmoid colon the inflammatory process does abut the sigmoid colon though only minimal colonic wall thickening is identified. The epicenter of the process appears to be centered medial to the cecum rather than at the sigmoid colon. Findings most favor perforated appendicitis rather than diverticulitis. Vascular/Lymphatic: Vascular structures patent.  No adenopathy. Reproductive: Unremarkable prostate gland and seminal vesicles Other: BILATERAL inguinal hernias containing fat. No free air or free fluid. Tiny umbilical hernia containing fat. Musculoskeletal: Unremarkable IMPRESSION: Large inflammatory process medial to the cecum 9.5 x 8.6 x 9.4 cm in size containing fluid, gas, and surrounding inflammatory changes. This likely represents perforated appendicitis with a large phlegmon and developing abscess though this is not entirely well-circumscribed; while this does abut the sigmoid colon which has evidence of diverticulosis, the epicenter of the inflammatory process is more centered in the RIGHT mid abdomen is more consistent with perforated appendicitis than perforated diverticulitis. Appendix is poorly localized within this collection. No free air or free fluid. BILATERAL inguinal umbilical hernias containing fat. Findings called to [facility] on 02/01/2021 at 1324 hours. Electronically Signed   By: Lavonia Dana M.D.   On: 02/01/2021 13:29   DG Chest Port 1 View  Result Date: 02/01/2021 CLINICAL DATA:  Sepsis.  Abdominal pain. EXAM: PORTABLE CHEST 1 VIEW COMPARISON:  None FINDINGS: The heart size and mediastinal contours are within normal limits. Both lungs are clear. The visualized skeletal structures  are unremarkable. IMPRESSION: No active disease. Electronically Signed   By: Kerby Moors M.D.   On: 02/01/2021 11:40   CT IMAGE GUIDED DRAINAGE BY PERCUTANEOUS CATHETER  Result Date: 02/03/2021 INDICATION: 50 year old male with presumed ruptured appendicitis with associated intra-abdominal abscess. EXAM: CT IMAGE GUIDED DRAINAGE BY PERCUTANEOUS CATHETER COMPARISON:  02/01/2021 MEDICATIONS: The patient is currently admitted to the hospital and receiving intravenous antibiotics. The antibiotics were administered within an appropriate time frame prior to the initiation of the procedure. ANESTHESIA/SEDATION: Moderate (conscious) sedation was employed during this procedure. A total of Versed 2 mg and Fentanyl 100 mcg was administered intravenously. Moderate Sedation Time: 21 minutes. The patient's level of consciousness and vital signs were monitored continuously by radiology nursing throughout the procedure under my direct supervision. CONTRAST:  None COMPLICATIONS: None immediate. PROCEDURE: Informed written consent was obtained from the patient after a discussion of the risks, benefits and alternatives to treatment. The patient was placed in the partial left lateral decubitus position on the CT gantry and a pre procedural CT was performed re-demonstrating the known abscess/fluid collection within the right lower quadrant. The procedure was planned. A timeout was performed prior to the initiation of the procedure. The right flank was prepped and draped in the usual sterile fashion. The overlying soft tissues were anesthetized with 1% lidocaine with epinephrine. Appropriate trajectory was planned with the use of  a 22 gauge spinal needle. An 18 gauge trocar needle was advanced into the abscess/fluid collection and a short Amplatz super stiff wire was coiled within the collection. Appropriate positioning was confirmed with a limited CT scan. The tract was serially dilated allowing placement of a 12 [place]  all-purpose drainage catheter. Appropriate positioning was confirmed with a limited postprocedural CT scan. Approximately 100 ml of purulent fluid was aspirated. The tube was connected to bulb suction and sutured in place. A dressing was placed. The patient tolerated the procedure well without immediate post procedural complication. IMPRESSION: Successful CT guided placement of a 72 French all purpose drain catheter into the right lower quadrant with aspiration of approximately 100 mL of purulent fluid. Samples were sent to the laboratory as requested by the ordering clinical team. Ruthann Cancer, MD [facility] Radiology Electronically Signed   By: Ruthann Cancer MD   On: 02/03/2021 09:59    Labs:  CBC: Recent Labs    02/01/21 1214 02/02/21 0127 02/03/21 0132 02/04/21 0129  WBC 23.6* 18.3* 14.6* 9.9  HGB 14.4 11.9* 12.2* 12.3*  HCT 41.5 35.4* 37.0* 35.6*  PLT 436* 346 392 436*    COAGS: Recent Labs    02/01/21 1214 02/03/21 0132  INR 1.1 1.1  APTT 26  --     BMP: Recent Labs    01/31/21 1158 02/01/21 1214 02/02/21 0127 02/03/21 0132  NA 133* 134* 136 135  K 3.8 3.8 3.6 3.3*  CL 98 98 101 101  CO2 24 23 25 24   GLUCOSE 108* 108* 102* 110*  BUN 6 7 6  5*  CALCIUM 9.0 9.1 8.4* 8.2*  CREATININE 0.88 0.83 0.83 0.82  GFRNONAA >60 >60 >60 >60    LIVER FUNCTION TESTS: Recent Labs    01/31/21 1158 02/01/21 1214 02/02/21 0127  BILITOT 1.3* 1.1 1.3*  AST 25 33 21  ALT 42 49* 40  ALKPHOS 73 74 59  PROT 7.2 7.7 5.8*  ALBUMIN 3.1* 3.2* 2.4*    Assessment and Plan:  50 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 Presented to the ED at [facility] with abdominal pain x 10 days. Found to have acute appendicitis with  perforated abscess. IR placed an abscess drain on 2.21.22. No leokocytosis. Patient is afebrile. Cultures from abscess dhows abundant WBC with abundant gram positive cocci and moderate gram negative rods. Cultures shows  moderate gram negative rods.  Per Epic output is 160 ml. 5 ml of purulent fluid noted to be in JP drain.   if patient is to be discharged, below are discharge instructions: - Flush each drain once daily with 5-10 cc NS flush (patient will need an order for flushes upon discharge). RN aware to teach patient how to manage drains at home. - Record output from each drain once daily. - Follow-up at drain clinic 10-14 days after discharge for CT/possible drain injection (assess for possible drain removal)- order placed to facilitate this.   Electronically Signed: Jacqualine Mau, NP 02/04/2021, 2:53 PM   I spent a total of 15 Minutes at the patient's bedside AND on the patient's hospital floor or unit, greater than 50% of which was counseling/coordinating care for perforated appendix abscess drain

## 2021-02-04 NOTE — Progress Notes (Signed)
Gave instructions on jp drain& and flushing to the pt, discharge instructions given, medications instructions given. Pt verbalizes understanding

## 2021-02-04 NOTE — Progress Notes (Signed)
[facility] Progress Note     Subjective: CC:  Denies abdominal pain or need for pain medications. Having about 3 liquid, non-bloody stools daily. Denies nausea or vomiting. Reports purulent drainage from perc drain that increases upon standing up. 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   Objective: Vital signs in last 24 hours: Temp:  [97.6 F (36.4 C)-98.1 F (36.7 C)] 98.1 F (36.7 C) (02/22 0521) Pulse Rate:  [71-79] 74 (02/22 0521) Resp:  [12-19] 18 (02/21 2234) BP: (104-141)/(68-96) 116/82 (02/22 0521) SpO2:  [98 %-100 %] 98 % (02/22 0521) Last BM Date: 02/03/21  Intake/Output from previous day: 02/21 0701 - 02/22 0700 In: 870 [P.O.:460; IV Piggyback:400] Out: 160 [Drains:160] Intake/Output this shift: No intake/output data recorded.  PE: Gen:  Alert, NAD, pleasant Card:  Regular rate and rhythm, pedal pulses 2+ BL Pulm:  Normal effort, clear to auscultation bilaterally Abd: Soft, non-tender, R RP perc drain with  purulent drainage in bulb, hyperactive bowel sounds all 4 quadrants Skin: warm and dry, no rashes  Psych: A&Ox3   Lab Results:  Recent Labs    02/03/21 0132 02/04/21 0129  WBC 14.6* 9.9  HGB 12.2* 12.3*  HCT 37.0* 35.6*  PLT 392 436*   BMET Recent Labs    02/02/21 0127 02/03/21 0132  NA 136 135  K 3.6 3.3*  CL 101 101  CO2 25 24  GLUCOSE 102* 110*  BUN 6 5*  CREATININE 0.83 0.82  CALCIUM 8.4* 8.2*   PT/INR Recent Labs    02/01/21 1214 02/03/21 0132  LABPROT 13.7 13.7  INR 1.1 1.1   CMP     Component Value Date/Time   NA 135 02/03/2021 0132   K 3.3 (L) 02/03/2021 0132   CL 101 02/03/2021 0132   CO2 24 02/03/2021 0132   GLUCOSE 110 (H) 02/03/2021 0132   BUN 5 (L) 02/03/2021 0132   CREATININE 0.82 02/03/2021 0132   CALCIUM 8.2 (L) 02/03/2021 0132   PROT 5.8 (L) 02/02/2021 0127   ALBUMIN 2.4 (L) 02/02/2021 0127   AST 21 02/02/2021 0127   ALT 40 02/02/2021 0127   ALKPHOS 59 02/02/2021 0127    BILITOT 1.3 (H) 02/02/2021 0127   GFRNONAA >60 02/03/2021 0132   Lipase     Component Value Date/Time   LIPASE 30 01/31/2021 1158       Studies/Results: CT IMAGE GUIDED DRAINAGE BY PERCUTANEOUS CATHETER  Result Date: 02/03/2021 INDICATION: 50 year old male with presumed ruptured appendicitis with associated intra-abdominal abscess. EXAM: CT IMAGE GUIDED DRAINAGE BY PERCUTANEOUS CATHETER COMPARISON:  02/01/2021 MEDICATIONS: The patient is currently admitted to the hospital and receiving intravenous antibiotics. The antibiotics were administered within an appropriate time frame prior to the initiation of the procedure. ANESTHESIA/SEDATION: Moderate (conscious) sedation was employed during this procedure. A total of Versed 2 mg and Fentanyl 100 mcg was administered intravenously. Moderate Sedation Time: 21 minutes. The patient's level of consciousness and vital signs were monitored continuously by radiology nursing throughout the procedure under my direct supervision. CONTRAST:  None COMPLICATIONS: None immediate. PROCEDURE: Informed written consent was obtained from the patient after a discussion of the risks, benefits and alternatives to treatment. The patient was placed in the partial left lateral decubitus position on the CT gantry and a pre procedural CT was performed re-demonstrating the known abscess/fluid collection within the right lower quadrant. The procedure was planned. A timeout was performed prior to the initiation of the procedure. The right flank was prepped  and draped in the usual sterile fashion. The overlying soft tissues were anesthetized with 1% lidocaine with epinephrine. Appropriate trajectory was planned with the use of a 22 gauge spinal needle. An 18 gauge trocar needle was advanced into the abscess/fluid collection and a short Amplatz super stiff wire was coiled within the collection. Appropriate positioning was confirmed with a limited CT scan. The tract was serially dilated  allowing placement of a 12 [place] all-purpose drainage catheter. Appropriate positioning was confirmed with a limited postprocedural CT scan. Approximately 100 ml of purulent fluid was aspirated. The tube was connected to bulb suction and sutured in place. A dressing was placed. The patient tolerated the procedure well without immediate post procedural complication. IMPRESSION: Successful CT guided placement of a 69 French all purpose drain catheter into the right lower quadrant with aspiration of approximately 100 mL of purulent fluid. Samples were sent to the laboratory as requested by the ordering clinical team. Ruthann Cancer, MD [facility] Radiology Electronically Signed   By: Ruthann Cancer MD   On: 02/03/2021 09:59    Anti-infectives: Anti-infectives (From admission, onward)   Start     Dose/Rate Route Frequency Ordered Stop   02/01/21 2200  vancomycin (VANCOREADY) IVPB 750 mg/150 mL        750 mg 150 mL/hr over 60 Minutes Intravenous Every 8 hours 02/01/21 1542     02/01/21 1600  piperacillin-tazobactam (ZOSYN) IVPB 3.375 g        3.375 g 12.5 mL/hr over 240 Minutes Intravenous Every 8 hours 02/01/21 1517     02/01/21 1145  vancomycin (VANCOREADY) IVPB 1750 mg/350 mL        1,750 mg 175 mL/hr over 120 Minutes Intravenous  Once 02/01/21 1059 02/01/21 1457   02/01/21 1100  ceFEPIme (MAXIPIME) 2 g in sodium chloride 0.9 % 100 mL IVPB        2 g 200 mL/hr over 30 Minutes Intravenous  Once 02/01/21 1059 02/01/21 1226   02/01/21 1100  metroNIDAZOLE (FLAGYL) IVPB 500 mg        500 mg 100 mL/hr over 60 Minutes Intravenous  Once 02/01/21 1059 02/01/21 1328     Assessment/Plan Acute appendicitis with abscess - afebrile, WBC 9.9 from 14 - s/p IR drain 2/21, GS w/ GPC and GNR, follow cultures  - transition to PO augment, D/C Zosyn - advance to SOFT diet   FEN: SOFT ID: [id number], uirine Cx w/ staph epidermis, IR Cx pending, Zosyn 2/19-2/22,  Augmentin 2/22 >> VTE: SCD's, Lovenox  Foley: none Dispo: advance to soft diet, change to PO abx, possible discharge home this afternoon   LOS: 3 days    Travis Forbes, [facility] Please see Amion for pager number during day hours 7:00am-4:30pm

## 2021-02-05 ENCOUNTER — Other Ambulatory Visit: Payer: Self-pay | Admitting: General Surgery

## 2021-02-05 DIAGNOSIS — K3533 Acute appendicitis with perforation and localized peritonitis, with abscess: Secondary | ICD-10-CM

## 2021-02-06 LAB — CULTURE, BLOOD (ROUTINE X 2)
Culture: NO GROWTH
Culture: NO GROWTH
Special Requests: ADEQUATE
Special Requests: ADEQUATE

## 2021-02-06 LAB — AEROBIC/ANAEROBIC CULTURE W GRAM STAIN (SURGICAL/DEEP WOUND)

## 2021-02-11 NOTE — Discharge Summary (Signed)
[facility] Discharge Summary   Patient ID: Travis Forbes MRN: [id number] DOB/AGE: [birth year] 50 y.o.  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  This likely represents perforated appendicitis with a large phlegmon and developing abscess though this is not entirely well-circumscribed; while this does abut the sigmoid colon which has evidence of diverticulosis, the epicenter of the inflammatory process is more centered in the RIGHT mid abdomen is more consistent with perforated appendicitis than perforated diverticulitis.  Appendix is poorly localized within this collection.  No free air or free fluid.  BILATERAL inguinal umbilical hernias containing fat.  Procedures CT guided placement of 12 French catheter into the right lower quadrant. (02/03/2021)  HPI: 50 yo male with 10 days of abdominal pain. He initially thought it was food poisoning from airport. After 2 days of rest he felt better but then gradually had worsening abdominal pain. Pain is constant and in lower abdomen. He denies fevers, nausea, or vomiting.  Hospital Course:  Workup showed intraabdominal abscess, suspect due to appendicitis.  Patient was admitted and underwent procedure listed above.  Tolerated procedure well and was transferred to the floor.  Cultures grew pan-sensitive E. Coli. Diet was advanced as tolerated.  On  02/05/20, the patient was voiding well, tolerating diet, ambulating well, pain well controlled, vital signs stable, having bowel function and felt stable for discharge home.  Patient will follow up in our office as below and knows to call with questions or concerns.  He will call to confirm appointment date/time.     Allergies as of 02/04/2021   No Known Allergies     Medication List    TAKE these medications   acetaminophen 325 MG tablet Commonly known as: Tylenol Take 2 tablets (650 mg total) by mouth every 6 (six) hours as needed.   Advil 200 MG tablet Generic drug: ibuprofen Take 200 mg by mouth every 6 (six) hours as needed for mild pain or headache.   amoxicillin-clavulanate 875-125 MG tablet Commonly known as: AUGMENTIN Take 1 tablet by mouth every 12 (twelve) hours for 10 days.   dicyclomine 20 MG tablet Commonly known as: BENTYL Take 1 tablet (20 mg total) by mouth 2 (two) times daily as needed for spasms (and abdominal pain).   FIBER PO Take 1 tablet by mouth daily.   multivitamin with minerals Tabs tablet Take 1 tablet by mouth daily.   VITAMIN C PO Take 1 tablet by mouth daily.         Follow-up Information    Kinsinger, Arta Bruce, MD Follow up in 3 week(s).   Specialty: General Surgery Why: our office is working on your follow up Contact information: [address] [place] [address] [phone]        Sandi Mariscal, MD. Call.   Specialties: Interventional Radiology, Radiology Why: to confirm appointment with interventional radiology regarding your drain. Contact information: [address] [place] [address] [phone]  Signed: Obie Dredge, [facility] Surgery 02/11/2021, 2:29 PM

## 2021-02-18 ENCOUNTER — Ambulatory Visit
Admission: RE | Admit: 2021-02-18 | Discharge: 2021-02-18 | Disposition: A | Discharge status: Home / Self Care | Payer: Managed Care, Other (non HMO) | Source: Ambulatory Visit | Attending: Radiology | Admitting: Radiology

## 2021-02-18 ENCOUNTER — Ambulatory Visit
Admission: RE | Admit: 2021-02-18 | Discharge: 2021-02-18 | Disposition: A | Discharge status: Home / Self Care | Payer: Managed Care, Other (non HMO) | Source: Ambulatory Visit | Attending: General Surgery | Admitting: General Surgery

## 2021-02-18 ENCOUNTER — Other Ambulatory Visit: Payer: Self-pay | Admitting: General Surgery

## 2021-02-18 DIAGNOSIS — K3533 Acute appendicitis with perforation and localized peritonitis, with abscess: Secondary | ICD-10-CM

## 2021-02-18 HISTORY — PX: IR RADIOLOGIST EVAL & MGMT: IMG5224

## 2021-02-18 MED ORDER — IOPAMIDOL (ISOVUE-300) INJECTION 61%
100.0000 mL | Freq: Once | INTRAVENOUS | Status: AC | PRN
  Administered 2021-02-18: 100 mL via INTRAVENOUS

## 2021-02-18 NOTE — Progress Notes (Signed)
Referring Physician(s): Dr. Kieth Brightly  Chief Complaint: The patient is seen in follow up today s/p drain placement to perforated appendix on 2.21.22  History of present illness:  50 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 Presented to the ED at [facility] with abdominal pain x 10 days. Found to have acute appendicitis with perforated abscess. IR placed an abscess drain on 2.21.22. Cultures from abscess grew e.coli.    No additional imaging since placement. [name] states that the output has been tapering off and is now between 6- 15 mls per day with 5 ml of NS flush. Output is serosanguinous in nature. He completed his antibiotics and denies any fevers or chills.Travis Forbes reports minimal abdominal pain around exit site with exertion.Per discharge summary Travis Forbes is to follow up with [facility] follow up in 3 weeks.      Past Medical History:  Diagnosis Date  . Gilbert syndrome   . Perennial allergic rhinitis with seasonal variation     Past Surgical History:  Procedure Laterality Date  . WISDOM TOOTH EXTRACTION      Allergies: Patient has no known allergies.  Medications: Prior to Admission medications   Medication Sig Start Date End Date Taking? Authorizing Provider  acetaminophen (TYLENOL) 325 MG tablet Take 2 tablets (650 mg total) by mouth every 6 (six) hours as needed. 02/04/21   Jill Alexanders, PA-C  Ascorbic Acid (VITAMIN C PO) Take 1 tablet by mouth daily.    [provider]  dicyclomine (BENTYL) 20 MG tablet Take 1 tablet (20 mg total) by mouth 2 (two) times daily as needed for spasms (and abdominal pain). 01/31/21   Katy Apo, NP  FIBER PO Take 1 tablet by mouth daily.    [provider]  ibuprofen (ADVIL) 200 MG tablet Take 200 mg by mouth every 6 (six) hours as needed for mild pain or headache.    [provider]  Multiple Vitamin (MULTIVITAMIN WITH MINERALS) TABS tablet Take 1 tablet by mouth daily.    [provider]     Family  History  Problem Relation Age of Onset  . Breast cancer Mother   . Heart attack Maternal Grandfather        late 5s  . Diabetes Maternal Grandfather   . Hypertension Paternal Grandmother   . Stroke Neg Hx     Social History   Socioeconomic History  . Marital status: Married    Spouse name: Not on file  . Number of children: Not on file  . Years of education: Not on file  . Highest education level: Not on file  Occupational History  . Not on file  Tobacco Use  . Smoking status: Former Smoker    Quit date: 12/14/1996    Years since quitting: 24.1  . Smokeless tobacco: Never Used  . Tobacco comment: smoked 1989-1998; up to 1 pack per week  Substance and Sexual Activity  . Alcohol use: Yes    Alcohol/week: 14.0 standard drinks    Types: 14 Cans of beer per week    Comment: Socially  . Drug use: No  . Sexual activity: Not on file  Other Topics Concern  . Not on file  Social History Narrative  . Not on file   Social Determinants of Health   Financial Resource Strain: Not on file  Food Insecurity: Not on file  Transportation Needs: Not on file  Physical Activity: Not on file  Stress: Not on file  Social Connections: Not on  file     Vital Signs: There were no vitals taken for this visit.  Physical Exam Vitals and nursing note reviewed.  Constitutional:      Appearance: He is well-developed and well-nourished.  HENT:     Head: Normocephalic.  Pulmonary:     Effort: Pulmonary effort is normal.  Abdominal:     Comments: Positive RLQ  (location) drain to suction. Site is unremarkable with no erythema, edema, tenderness, bleeding or drainage noted at exit site. Suture and stat lock in place.  3 ml of  serosanginous colored fluid noted in bulb suction device.   Musculoskeletal:        General: Normal range of motion.     Cervical back: Normal range of motion.  Skin:    General: Skin is dry.  Neurological:     Mental Status: He is alert and oriented to person,  place, and time.  Psychiatric:        Mood and Affect: Mood and affect normal.     Imaging: No results found.  Labs:  CBC: Recent Labs    02/01/21 1214 02/02/21 0127 02/03/21 0132 02/04/21 0129  WBC 23.6* 18.3* 14.6* 9.9  HGB 14.4 11.9* 12.2* 12.3*  HCT 41.5 35.4* 37.0* 35.6*  PLT 436* 346 392 436*    COAGS: Recent Labs    02/01/21 1214 02/03/21 0132  INR 1.1 1.1  APTT 26  --     BMP: Recent Labs    01/31/21 1158 02/01/21 1214 02/02/21 0127 02/03/21 0132  NA 133* 134* 136 135  K 3.8 3.8 3.6 3.3*  CL 98 98 101 101  CO2 24 23 25 24   GLUCOSE 108* 108* 102* 110*  BUN 6 7 6  5*  CALCIUM 9.0 9.1 8.4* 8.2*  CREATININE 0.88 0.83 0.83 0.82  GFRNONAA >60 >60 >60 >60    LIVER FUNCTION TESTS: Recent Labs    01/31/21 1158 02/01/21 1214 02/02/21 0127  BILITOT 1.3* 1.1 1.3*  AST 25 33 21  ALT 42 49* 40  ALKPHOS 73 74 59  PROT 7.2 7.7 5.8*  ALBUMIN 3.1* 3.2* 2.4*    Assessment:  50 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 Presented to the ED at [facility] with abdominal pain x 10 days. Found to have acute appendicitis with perforated abscess. IR placed an abscess drain on 2.21.22. Cultures from abscess grew e.coli.    No additional imaging since placement. [name] states that the output has been tapering off and is now between 6- 15 mls per day with 5 ml of NS flush. Output is serosanguinous in nature. He completed his antibiotics and denies any fevers or chills.Travis Forbes reports minimal abdominal pain around exit site with exertion.Per discharge summary Travis Forbes is to follow up with [facility] follow up in 3 weeks.     Follow-up drain injection today revealsresolved abscess cavity without fistula. Images were reviewed by Dr. Annamaria Boots. Drain was placed to a gravity bag with no irrigation of drain. IR follow up in 2 weeks with abscessogram only no CT scan. Recommend follow-up with Dr. Kieth Brightly to further discuss surgical optionsat this stage.  Signed: Jacqualine Mau,  NP 02/18/2021, 1:55 PM   Please refer to Dr. Annamaria Boots attestation of this note for management and plan.

## 2021-03-05 ENCOUNTER — Other Ambulatory Visit: Payer: Managed Care, Other (non HMO)

## 2021-03-21 ENCOUNTER — Ambulatory Visit: Payer: Self-pay | Admitting: General Surgery

## 2021-04-10 NOTE — Patient Instructions (Addendum)
DUE TO COVID-19 ONLY ONE VISITOR IS ALLOWED TO COME WITH YOU AND STAY IN THE WAITING ROOM ONLY DURING PRE OP AND PROCEDURE DAY OF SURGERY. THE 1 VISITOR  MAY VISIT WITH YOU AFTER SURGERY IN YOUR PRIVATE ROOM DURING VISITING HOURS ONLY!  Covid test 5/2_____ @__2 :55 pm_____, THIS TEST MUST BE DONE BEFORE SURGERY,   COVID TESTING SITE [place] [id number],  IT IS ON THE RIGHT GOING OUT [address] APPROXIMATELY  2 MINUTES PAST [employer] ON THE RIGHT. ONCE YOUR COVID TEST IS COMPLETED,  PLEASE BEGIN THE QUARANTINE INSTRUCTIONS AS OUTLINED IN YOUR HANDOUT.                Travis Forbes   Your procedure is scheduled on: 04/17/21   Report to [facility] Main  Entrance   Report to admitting at  10:30 AM     Call this number if you have problems the morning of surgery [phone]   No food after midnight.    You may have clear liquid until 9:30 AM.    At 9:00 AM drink pre surgery drink.   Nothing by mouth after 9:30 AM.    . BRUSH YOUR TEETH MORNING OF SURGERY AND RINSE YOUR MOUTH OUT, NO CHEWING GUM CANDY OR MINTS.     Take these medicines the morning of surgery with A SIP OF WATER: none                                 You may not have any metal on your body including              piercings  Do not wear jewelry,  lotions, powders or deodorant              Men may shave face and neck.   Do not bring valuables to the hospital. [place].  Contacts, dentures or bridgework may not be worn into surgery.       Patients discharged the day of surgery will not be allowed to drive home.   IF YOU ARE HAVING SURGERY AND GOING HOME THE SAME DAY, YOU MUST HAVE AN ADULT TO DRIVE YOU HOME AND BE WITH YOU FOR 24 HOURS.  YOU MAY GO HOME BY TAXI OR UBER OR ORTHERWISE, BUT AN ADULT MUST ACCOMPANY YOU HOME AND STAY WITH YOU FOR 24 HOURS.  Name and phone number of your driver:  Special Instructions:  N/A              Please read over the following fact sheets you were given: _____________________________________________________________________             [facility] - Preparing for Surgery Before surgery, you can play an important role.  Because skin is not sterile, your skin needs to be as free of germs as possible.  You can reduce the number of germs on your skin by washing with CHG (chlorahexidine gluconate) soap before surgery.  CHG is an antiseptic cleaner which kills germs and bonds with the skin to continue killing germs even after washing. Please DO NOT use if you have an allergy to CHG or antibacterial soaps.  If your skin becomes reddened/irritated stop using the CHG and inform your nurse when you arrive at Short Stay.   You may  shave your face/neck.  Please follow these instructions carefully:  1.  Shower with CHG Soap the night before surgery and the  morning of Surgery.  2.  If you choose to wash your hair, wash your hair first as usual with your  normal  shampoo.  3.  After you shampoo, rinse your hair and body thoroughly to remove the  shampoo.                                        4.  Use CHG as you would any other liquid soap.  You can apply chg directly  to the skin and wash                       Gently with a scrungie or clean washcloth.  5.  Apply the CHG Soap to your body ONLY FROM THE NECK DOWN.   Do not use on face/ open                           Wound or open sores. Avoid contact with eyes, ears mouth and genitals (private parts).                       Wash face,  Genitals (private parts) with your normal soap.             6.  Wash thoroughly, paying special attention to the area where your surgery  will be performed.  7.  Thoroughly rinse your body with warm water from the neck down.  8.  DO NOT shower/wash with your normal soap after using and rinsing off  the CHG Soap.             9.  Pat yourself dry with a clean towel.            10.  Wear clean pajamas.             11.  Place clean sheets on your bed the night of your first shower and do not  sleep with pets. Day of Surgery : Do not apply any lotions/deodorants the morning of surgery.  Please wear clean clothes to the hospital/surgery center.  FAILURE TO FOLLOW THESE INSTRUCTIONS MAY RESULT IN THE CANCELLATION OF YOUR SURGERY PATIENT SIGNATURE_________________________________  NURSE SIGNATURE__________________________________  ________________________________________________________________________

## 2021-04-11 ENCOUNTER — Encounter (HOSPITAL_COMMUNITY)
Admission: RE | Admit: 2021-04-11 | Discharge: 2021-04-11 | Disposition: A | Discharge status: Home / Self Care | Payer: Managed Care, Other (non HMO) | Source: Ambulatory Visit | Attending: General Surgery | Admitting: General Surgery

## 2021-04-11 ENCOUNTER — Other Ambulatory Visit: Payer: Self-pay

## 2021-04-11 ENCOUNTER — Encounter (HOSPITAL_COMMUNITY): Payer: Self-pay

## 2021-04-11 DIAGNOSIS — Z01812 Encounter for preprocedural laboratory examination: Secondary | ICD-10-CM | POA: Insufficient documentation

## 2021-04-11 LAB — COMPREHENSIVE METABOLIC PANEL
ALT: 35 U/L (ref 0–44)
AST: 27 U/L (ref 15–41)
Albumin: 4.6 g/dL (ref 3.5–5.0)
Alkaline Phosphatase: 53 U/L (ref 38–126)
Anion gap: 8 (ref 5–15)
BUN: 15 mg/dL (ref 6–20)
CO2: 28 mmol/L (ref 22–32)
Calcium: 9.9 mg/dL (ref 8.9–10.3)
Chloride: 105 mmol/L (ref 98–111)
Creatinine, Ser: 0.75 mg/dL (ref 0.61–1.24)
GFR, Estimated: >60 mL/min (ref >60)
Glucose, Bld: 92 mg/dL (ref 70–99)
Potassium: 4.2 mmol/L (ref 3.5–5.1)
Sodium: 141 mmol/L (ref 135–145)
Total Bilirubin: 1.4 mg/dL — ABNORMAL HIGH (ref 0.3–1.2)
Total Protein: 7.6 g/dL (ref 6.5–8.1)

## 2021-04-11 LAB — CBC
HCT: 44.3 % (ref 39.0–52.0)
Hemoglobin: 14.8 g/dL (ref 13.0–17.0)
MCH: 30.8 pg (ref 26.0–34.0)
MCHC: 33.4 g/dL (ref 30.0–36.0)
MCV: 92.1 fL (ref 80.0–100.0)
Platelets: 211 10*3/uL (ref 150–400)
RBC: 4.81 MIL/uL (ref 4.22–5.81)
RDW: 12.9 % (ref 11.5–15.5)
WBC: 6.8 10*3/uL (ref 4.0–10.5)
nRBC: 0 % (ref 0.0–0.2)

## 2021-04-11 NOTE — Progress Notes (Addendum)
COVID Vaccine Completed:yes Date COVID Vaccine completed:02/24/20-booster 11/26/20-pfizer COVID vaccine manufacturer:  Moderna     PCP - Pt was using Dr. Linna Darner who retired. Then [facility] medical. Pt will see a new Dr. In May Pricilla Holm  Cardiologist - none  Chest x-ray - no EKG - no Stress Test - no ECHO - no Cardiac Cath - no Pacemaker/ICD device last checked:NA  Sleep Study - no CPAP -   Fasting Blood Sugar - NA Checks Blood Sugar _____ times a day  Blood Thinner Instructions:NA Aspirin Instructions: Last Dose:  Anesthesia review:   Patient denies shortness of breath, fever, cough and chest pain at PAT appointment  Yes. Pt is in good shape and has no SOB with any activities.  Patient verbalized understanding of instructions that were given to them at the PAT appointment. Patient was also instructed that they will need to review over the PAT instructions again at home before surgeryYes

## 2021-04-14 ENCOUNTER — Other Ambulatory Visit (HOSPITAL_COMMUNITY)
Admission: RE | Admit: 2021-04-14 | Discharge: 2021-04-14 | Disposition: A | Discharge status: Home / Self Care | Payer: Managed Care, Other (non HMO) | Source: Ambulatory Visit | Attending: General Surgery | Admitting: General Surgery

## 2021-04-14 DIAGNOSIS — Z20822 Contact with and (suspected) exposure to covid-19: Secondary | ICD-10-CM | POA: Insufficient documentation

## 2021-04-14 DIAGNOSIS — Z01812 Encounter for preprocedural laboratory examination: Secondary | ICD-10-CM | POA: Insufficient documentation

## 2021-04-15 LAB — SARS CORONAVIRUS 2 (TAT 6-24 HRS): SARS Coronavirus 2: NEGATIVE

## 2021-04-17 ENCOUNTER — Encounter (HOSPITAL_COMMUNITY): Payer: Self-pay | Admitting: General Surgery

## 2021-04-17 ENCOUNTER — Ambulatory Visit (HOSPITAL_COMMUNITY): Payer: Managed Care, Other (non HMO) | Admitting: Anesthesiology

## 2021-04-17 ENCOUNTER — Ambulatory Visit (HOSPITAL_COMMUNITY)
Admission: RE | Admit: 2021-04-17 | Discharge: 2021-04-17 | Disposition: A | Discharge status: Home / Self Care | Payer: Managed Care, Other (non HMO) | Attending: General Surgery | Admitting: General Surgery

## 2021-04-17 ENCOUNTER — Other Ambulatory Visit: Payer: Self-pay

## 2021-04-17 ENCOUNTER — Encounter (HOSPITAL_COMMUNITY)
Admission: RE | Disposition: A | Discharge status: Home / Self Care | Payer: Self-pay | Source: Home / Self Care | Attending: General Surgery

## 2021-04-17 DIAGNOSIS — E669 Obesity, unspecified: Secondary | ICD-10-CM | POA: Insufficient documentation

## 2021-04-17 DIAGNOSIS — Z8719 Personal history of other diseases of the digestive system: Secondary | ICD-10-CM | POA: Insufficient documentation

## 2021-04-17 DIAGNOSIS — Z6832 Body mass index (BMI) 32.0-32.9, adult: Secondary | ICD-10-CM | POA: Insufficient documentation

## 2021-04-17 DIAGNOSIS — Z79899 Other long term (current) drug therapy: Secondary | ICD-10-CM | POA: Diagnosis not present

## 2021-04-17 DIAGNOSIS — Z87891 Personal history of nicotine dependence: Secondary | ICD-10-CM | POA: Diagnosis not present

## 2021-04-17 DIAGNOSIS — K36 Other appendicitis: Secondary | ICD-10-CM | POA: Diagnosis present

## 2021-04-17 HISTORY — PX: LAPAROSCOPIC APPENDECTOMY: SHX408

## 2021-04-17 SURGERY — APPENDECTOMY, LAPAROSCOPIC
Anesthesia: General

## 2021-04-17 MED ORDER — CHLORHEXIDINE GLUCONATE 0.12 % MT SOLN
15.0000 mL | Freq: Once | OROMUCOSAL | Status: AC
  Administered 2021-04-17: 15 mL via OROMUCOSAL

## 2021-04-17 MED ORDER — ENSURE PRE-SURGERY PO LIQD
296.0000 mL | Freq: Once | ORAL | Status: DC
  Filled 2021-04-17: qty 296

## 2021-04-17 MED ORDER — OXYCODONE HCL 5 MG PO TABS: 5.0000 mg | ORAL_TABLET | Freq: Once | ORAL | Status: DC | PRN

## 2021-04-17 MED ORDER — ACETAMINOPHEN 500 MG PO TABS
1000.0000 mg | ORAL_TABLET | ORAL | Status: AC
  Administered 2021-04-17: 1000 mg via ORAL
  Filled 2021-04-17: qty 2

## 2021-04-17 MED ORDER — KETOROLAC TROMETHAMINE 30 MG/ML IJ SOLN: 30.0000 mg | Freq: Once | INTRAMUSCULAR | Status: DC | PRN

## 2021-04-17 MED ORDER — DEXAMETHASONE SODIUM PHOSPHATE 4 MG/ML IJ SOLN
INTRAMUSCULAR | Status: DC | PRN
  Administered 2021-04-17: 10 mg via INTRAVENOUS

## 2021-04-17 MED ORDER — LIDOCAINE 2% (20 MG/ML) 5 ML SYRINGE
INTRAMUSCULAR | Status: AC
  Filled 2021-04-17: qty 5

## 2021-04-17 MED ORDER — OXYCODONE HCL 5 MG/5ML PO SOLN: 5.0000 mg | Freq: Once | ORAL | Status: DC | PRN

## 2021-04-17 MED ORDER — BUPIVACAINE-EPINEPHRINE 0.25% -1:200000 IJ SOLN
INTRAMUSCULAR | Status: DC | PRN
  Administered 2021-04-17: 30 mL

## 2021-04-17 MED ORDER — CEFAZOLIN SODIUM-DEXTROSE 2-4 GM/100ML-% IV SOLN
2.0000 g | INTRAVENOUS | Status: AC
  Administered 2021-04-17: 2 g via INTRAVENOUS
  Filled 2021-04-17: qty 100

## 2021-04-17 MED ORDER — FENTANYL CITRATE (PF) 250 MCG/5ML IJ SOLN
INTRAMUSCULAR | Status: AC
  Filled 2021-04-17: qty 5

## 2021-04-17 MED ORDER — ROCURONIUM BROMIDE 100 MG/10ML IV SOLN
INTRAVENOUS | Status: DC | PRN
  Administered 2021-04-17: 100 mg via INTRAVENOUS

## 2021-04-17 MED ORDER — PROMETHAZINE HCL 25 MG/ML IJ SOLN: 6.2500 mg | INTRAMUSCULAR | Status: DC | PRN

## 2021-04-17 MED ORDER — RINGERS IRRIGATION IR SOLN
Status: DC | PRN
  Administered 2021-04-17: 1000 mL

## 2021-04-17 MED ORDER — OXYCODONE HCL 5 MG PO TABS: 5.0000 mg | ORAL_TABLET | Freq: Four times a day (QID) | ORAL | 0 refills | Status: DC | PRN

## 2021-04-17 MED ORDER — SUGAMMADEX SODIUM 200 MG/2ML IV SOLN
INTRAVENOUS | Status: DC | PRN
  Administered 2021-04-17: 200 mg via INTRAVENOUS

## 2021-04-17 MED ORDER — BUPIVACAINE LIPOSOME 1.3 % IJ SUSP
20.0000 mL | Freq: Once | INTRAMUSCULAR | Status: DC
  Filled 2021-04-17: qty 20

## 2021-04-17 MED ORDER — MIDAZOLAM HCL 5 MG/5ML IJ SOLN
INTRAMUSCULAR | Status: DC | PRN
  Administered 2021-04-17: 2 mg via INTRAVENOUS

## 2021-04-17 MED ORDER — MIDAZOLAM HCL 2 MG/2ML IJ SOLN
INTRAMUSCULAR | Status: AC
  Filled 2021-04-17: qty 2

## 2021-04-17 MED ORDER — ACETAMINOPHEN 500 MG PO TABS: 1000.0000 mg | ORAL_TABLET | Freq: Once | ORAL | Status: DC

## 2021-04-17 MED ORDER — CELECOXIB 200 MG PO CAPS
400.0000 mg | ORAL_CAPSULE | ORAL | Status: AC
  Administered 2021-04-17: 400 mg via ORAL
  Filled 2021-04-17: qty 2

## 2021-04-17 MED ORDER — LIDOCAINE 2% (20 MG/ML) 5 ML SYRINGE
INTRAMUSCULAR | Status: DC | PRN
  Administered 2021-04-17: 60 mg via INTRAVENOUS

## 2021-04-17 MED ORDER — ESMOLOL HCL 100 MG/10ML IV SOLN
INTRAVENOUS | Status: DC | PRN
  Administered 2021-04-17: 30 mg via INTRAVENOUS

## 2021-04-17 MED ORDER — PROPOFOL 10 MG/ML IV BOLUS
INTRAVENOUS | Status: DC | PRN
  Administered 2021-04-17: 50 mg via INTRAVENOUS
  Administered 2021-04-17: 20 mg via INTRAVENOUS
  Administered 2021-04-17: 150 mg via INTRAVENOUS
  Administered 2021-04-17 (×2): 20 mg via INTRAVENOUS

## 2021-04-17 MED ORDER — SODIUM CHLORIDE 0.9 % IR SOLN
Status: DC | PRN
  Administered 2021-04-17: 1000 mL

## 2021-04-17 MED ORDER — ORAL CARE MOUTH RINSE: 15.0000 mL | Freq: Once | OROMUCOSAL | Status: AC

## 2021-04-17 MED ORDER — IBUPROFEN 800 MG PO TABS: 800.0000 mg | ORAL_TABLET | Freq: Three times a day (TID) | ORAL | 0 refills | Status: DC | PRN

## 2021-04-17 MED ORDER — DEXMEDETOMIDINE (PRECEDEX) IN NS 20 MCG/5ML (4 MCG/ML) IV SYRINGE
PREFILLED_SYRINGE | INTRAVENOUS | Status: DC | PRN
  Administered 2021-04-17: 8 ug via INTRAVENOUS
  Administered 2021-04-17: 12 ug via INTRAVENOUS
  Administered 2021-04-17: 8 ug via INTRAVENOUS
  Administered 2021-04-17: 12 ug via INTRAVENOUS

## 2021-04-17 MED ORDER — HYDROMORPHONE HCL 1 MG/ML IJ SOLN: 0.2500 mg | INTRAMUSCULAR | Status: DC | PRN

## 2021-04-17 MED ORDER — CHLORHEXIDINE GLUCONATE CLOTH 2 % EX PADS: 6.0000 | MEDICATED_PAD | Freq: Once | CUTANEOUS | Status: DC

## 2021-04-17 MED ORDER — DEXMEDETOMIDINE (PRECEDEX) IN NS 20 MCG/5ML (4 MCG/ML) IV SYRINGE
PREFILLED_SYRINGE | INTRAVENOUS | Status: AC
  Filled 2021-04-17: qty 5

## 2021-04-17 MED ORDER — MEPERIDINE HCL 50 MG/ML IJ SOLN: 6.2500 mg | INTRAMUSCULAR | Status: DC | PRN

## 2021-04-17 MED ORDER — ONDANSETRON HCL 4 MG/2ML IJ SOLN
INTRAMUSCULAR | Status: AC
  Filled 2021-04-17: qty 2

## 2021-04-17 MED ORDER — BUPIVACAINE-EPINEPHRINE (PF) 0.25% -1:200000 IJ SOLN
INTRAMUSCULAR | Status: AC
  Filled 2021-04-17: qty 30

## 2021-04-17 MED ORDER — FENTANYL CITRATE (PF) 100 MCG/2ML IJ SOLN
INTRAMUSCULAR | Status: DC | PRN
  Administered 2021-04-17: 50 ug via INTRAVENOUS
  Administered 2021-04-17 (×2): 100 ug via INTRAVENOUS
  Administered 2021-04-17 (×2): 50 ug via INTRAVENOUS

## 2021-04-17 MED ORDER — ROCURONIUM BROMIDE 10 MG/ML (PF) SYRINGE
PREFILLED_SYRINGE | INTRAVENOUS | Status: AC
  Filled 2021-04-17: qty 10

## 2021-04-17 MED ORDER — FENTANYL CITRATE (PF) 100 MCG/2ML IJ SOLN
INTRAMUSCULAR | Status: AC
  Filled 2021-04-17: qty 2

## 2021-04-17 MED ORDER — DIPHENHYDRAMINE HCL 50 MG/ML IJ SOLN
INTRAMUSCULAR | Status: DC | PRN
  Administered 2021-04-17: 12.5 mg via INTRAVENOUS

## 2021-04-17 MED ORDER — DEXAMETHASONE SODIUM PHOSPHATE 10 MG/ML IJ SOLN
INTRAMUSCULAR | Status: AC
  Filled 2021-04-17: qty 1

## 2021-04-17 MED ORDER — ONDANSETRON HCL 4 MG/2ML IJ SOLN
INTRAMUSCULAR | Status: DC | PRN
  Administered 2021-04-17: 4 mg via INTRAVENOUS

## 2021-04-17 MED ORDER — LACTATED RINGERS IV SOLN: INTRAVENOUS | Status: DC

## 2021-04-17 SURGICAL SUPPLY — 53 items
ADH SKN CLS APL DERMABOND .7 (GAUZE/BANDAGES/DRESSINGS) ×1
APL SKNCLS STERI-STRIP NONHPOA (GAUZE/BANDAGES/DRESSINGS) ×2
APPLIER CLIP 5 13 M/L LIGAMAX5 (MISCELLANEOUS)
APPLIER CLIP ROT 10 11.4 M/L (STAPLE)
APR CLP MED LRG 11.4X10 (STAPLE)
APR CLP MED LRG 5 ANG JAW (MISCELLANEOUS)
BAG SPEC RTRVL 10 TROC 200 (ENDOMECHANICALS) ×1
BENZOIN TINCTURE PRP APPL 2/3 (GAUZE/BANDAGES/DRESSINGS) ×4 IMPLANT
BNDG ADH 1X3 SHEER STRL LF (GAUZE/BANDAGES/DRESSINGS) ×10 IMPLANT
BNDG ADH THN 3X1 STRL LF (GAUZE/BANDAGES/DRESSINGS) ×5
CABLE HIGH FREQUENCY MONO STRZ (ELECTRODE) ×2 IMPLANT
CLIP APPLIE 5 13 M/L LIGAMAX5 (MISCELLANEOUS) IMPLANT
CLIP APPLIE ROT 10 11.4 M/L (STAPLE) IMPLANT
CLIP VESOLOCK XL 6/CT (CLIP) ×2 IMPLANT
COVER SURGICAL LIGHT HANDLE (MISCELLANEOUS) ×2 IMPLANT
COVER WAND RF STERILE (DRAPES) IMPLANT
CUTTER FLEX LINEAR 45M (STAPLE) ×1 IMPLANT
DECANTER SPIKE VIAL GLASS SM (MISCELLANEOUS) ×2 IMPLANT
DERMABOND ADVANCED (GAUZE/BANDAGES/DRESSINGS) ×1
DERMABOND ADVANCED .7 DNX12 (GAUZE/BANDAGES/DRESSINGS) IMPLANT
DRAIN CHANNEL 19F RND (DRAIN) IMPLANT
DRAPE LAPAROSCOPIC ABDOMINAL (DRAPES) ×2 IMPLANT
ELECT REM PT RETURN 15FT ADLT (MISCELLANEOUS) ×2 IMPLANT
ENDOLOOP SUT PDS II  0 18 (SUTURE)
ENDOLOOP SUT PDS II 0 18 (SUTURE) IMPLANT
EVACUATOR SILICONE 100CC (DRAIN) IMPLANT
GLOVE SURG POLYISO LF SZ7 (GLOVE) ×2 IMPLANT
GLOVE SURG UNDER POLY LF SZ7 (GLOVE) ×2 IMPLANT
GOWN STRL REUS W/TWL LRG LVL3 (GOWN DISPOSABLE) ×2 IMPLANT
GOWN STRL REUS W/TWL XL LVL3 (GOWN DISPOSABLE) IMPLANT
GRASPER SUT TROCAR 14GX15 (MISCELLANEOUS) IMPLANT
HEMOSTAT SNOW SURGICEL 2X4 (HEMOSTASIS) ×1 IMPLANT
KIT BASIN OR (CUSTOM PROCEDURE TRAY) ×2 IMPLANT
KIT TURNOVER KIT A (KITS) ×2 IMPLANT
PENCIL SMOKE EVACUATOR (MISCELLANEOUS) IMPLANT
POUCH RETRIEVAL ECOSAC 10 (ENDOMECHANICALS) ×1 IMPLANT
POUCH RETRIEVAL ECOSAC 10MM (ENDOMECHANICALS) ×2
RELOAD STAPLE TA45 3.5 REG BLU (ENDOMECHANICALS) ×2 IMPLANT
SCISSORS LAP 5X35 DISP (ENDOMECHANICALS) ×2 IMPLANT
SET IRRIG TUBING LAPAROSCOPIC (IRRIGATION / IRRIGATOR) ×2 IMPLANT
SET TUBE SMOKE EVAC HIGH FLOW (TUBING) ×2 IMPLANT
SHEARS HARMONIC ACE PLUS 36CM (ENDOMECHANICALS) ×1 IMPLANT
SLEEVE XCEL OPT CAN 5 100 (ENDOMECHANICALS) ×2 IMPLANT
STRIP CLOSURE SKIN 1/2X4 (GAUZE/BANDAGES/DRESSINGS) ×2 IMPLANT
STRIP CLOSURE SKIN 1/4X4 (GAUZE/BANDAGES/DRESSINGS) ×2 IMPLANT
SUT ETHILON 2 0 PS N (SUTURE) IMPLANT
SUT MNCRL AB 4-0 PS2 18 (SUTURE) ×2 IMPLANT
TOWEL OR 17X26 10 PK STRL BLUE (TOWEL DISPOSABLE) ×2 IMPLANT
TOWEL OR NON WOVEN STRL DISP B (DISPOSABLE) ×2 IMPLANT
TRAY LAPAROSCOPIC (CUSTOM PROCEDURE TRAY) ×2 IMPLANT
TROCAR BLADELESS OPT 5 100 (ENDOMECHANICALS) ×2 IMPLANT
TROCAR XCEL 12X100 BLDLESS (ENDOMECHANICALS) ×2 IMPLANT
TROCAR XCEL NON-BLD 5MMX100MML (ENDOMECHANICALS) ×2 IMPLANT

## 2021-04-17 NOTE — Anesthesia Postprocedure Evaluation (Signed)
Anesthesia Post Note  Patient: Travis Forbes  Procedure(s) Performed: APPENDECTOMY LAPAROSCOPIC (N/A )     Patient location during evaluation: PACU Anesthesia Type: General Level of consciousness: awake and alert, oriented and patient cooperative Pain management: pain level controlled Vital Signs Assessment: post-procedure vital signs reviewed and stable Respiratory status: spontaneous breathing, nonlabored ventilation and respiratory function stable Cardiovascular status: blood pressure returned to baseline and stable Postop Assessment: no apparent nausea or vomiting Anesthetic complications: no   No complications documented.  Last Vitals:  Vitals:   04/17/21 1415 04/17/21 1500  BP: (!) 135/92 (!) 157/96  Pulse: 64 63  Resp:  12  Temp:  36.6 C  SpO2: 98% 100%    Last Pain:  Vitals:   04/17/21 1500  TempSrc: Oral  PainSc:                  Pervis Hocking

## 2021-04-17 NOTE — Anesthesia Procedure Notes (Signed)
Procedure Name: Intubation Date/Time: 04/17/2021 12:29 PM Performed by: Eulas Post, Jodilyn Giese W, CRNA Pre-anesthesia Checklist: Patient identified, Emergency Drugs available, Suction available and Patient being monitored Patient Re-evaluated:Patient Re-evaluated prior to induction Oxygen Delivery Method: Circle system utilized Preoxygenation: Pre-oxygenation with 100% oxygen Induction Type: IV induction Ventilation: Mask ventilation without difficulty Laryngoscope Size: Miller and 2 Grade View: Grade I Tube type: Oral Tube size: 7.5 mm Number of attempts: 1 Airway Equipment and Method: Stylet Placement Confirmation: ETT inserted through vocal cords under direct vision,  positive ETCO2 and breath sounds checked- equal and bilateral Secured at: 22 cm Tube secured with: Tape Dental Injury: Teeth and Oropharynx as per pre-operative assessment

## 2021-04-17 NOTE — H&P (Signed)
Travis Forbes is an 50 y.o. male.   Chief Complaint: history of appendicitis HPI: 50 yo male with history of perforated appendicitis treated with antibiotics and drainage. He presents for interval appendectomy  Past Medical History:  Diagnosis Date  . Gilbert syndrome   . Perennial allergic rhinitis with seasonal variation     Past Surgical History:  Procedure Laterality Date  . IR RADIOLOGIST EVAL & MGMT  02/18/2021  . WISDOM TOOTH EXTRACTION      Family History  Problem Relation Age of Onset  . Breast cancer Mother   . Heart attack Maternal Grandfather        late 56s  . Diabetes Maternal Grandfather   . Hypertension Paternal Grandmother   . Stroke Neg Hx    Social History:  reports that he quit smoking about 24 years ago. He has never used smokeless tobacco. He reports current alcohol use of about 14.0 standard drinks of alcohol per week. He reports that he does not use drugs.  Allergies: No Known Allergies  Medications Prior to Admission  Medication Sig Dispense Refill  . Ascorbic Acid (VITAMIN C PO) Take 1 tablet by mouth daily.    . fluticasone (FLONASE SENSIMIST) 27.5 MCG/SPRAY nasal spray Place 2 sprays into the nose daily.    Marland Kitchen ibuprofen (ADVIL) 200 MG tablet Take 200-400 mg by mouth every 6 (six) hours as needed for mild pain or headache.    . Multiple Vitamin (MULTIVITAMIN WITH MINERALS) TABS tablet Take 1 tablet by mouth daily.    Marland Kitchen acetaminophen (TYLENOL) 325 MG tablet Take 2 tablets (650 mg total) by mouth every 6 (six) hours as needed. (Patient not taking: Reported on 04/04/2021)    . dicyclomine (BENTYL) 20 MG tablet Take 1 tablet (20 mg total) by mouth 2 (two) times daily as needed for spasms (and abdominal pain). (Patient not taking: Reported on 04/04/2021) 20 tablet 0  . FIBER PO Take 1 tablet by mouth daily. (Patient not taking: Reported on 04/04/2021)      No results found for this or any previous visit (from the past 48 hour(s)). No results found.  Review of  Systems  Constitutional: Negative for chills and fever.  HENT: Negative for hearing loss.   Respiratory: Negative for cough.   Cardiovascular: Negative for chest pain and palpitations.  Gastrointestinal: Negative for abdominal pain, nausea and vomiting.  Genitourinary: Negative for dysuria and urgency.  Musculoskeletal: Negative for myalgias and neck pain.  Skin: Negative for rash.  Neurological: Negative for dizziness and headaches.  Hematological: Does not bruise/bleed easily.  Psychiatric/Behavioral: Negative for suicidal ideas.    Blood pressure (!) 148/97, pulse 69, temperature 98.2 F (36.8 C), temperature source Oral, height 5\' 8"  (1.727 m), weight 96.6 kg, SpO2 100 %. Physical Exam Vitals reviewed.  Constitutional:      Appearance: He is well-developed.  HENT:     Head: Normocephalic and atraumatic.  Eyes:     Conjunctiva/sclera: Conjunctivae normal.     Pupils: Pupils are equal, round, and reactive to light.  Cardiovascular:     Rate and Rhythm: Normal rate and regular rhythm.  Pulmonary:     Effort: Pulmonary effort is normal.     Breath sounds: Normal breath sounds.  Abdominal:     General: Bowel sounds are normal. There is no distension.     Palpations: Abdomen is soft.     Tenderness: There is no abdominal tenderness.  Musculoskeletal:        General: Normal range  of motion.     Cervical back: Normal range of motion and neck supple.  Skin:    General: Skin is warm and dry.  Neurological:     Mental Status: He is alert and oriented to person, place, and time.  Psychiatric:        Behavior: Behavior normal.      Assessment/Plan 50 yo male with h/o ruptured appendicitis -lap appendectomy -ERAS protocol -planned outpatient procedure  Mickeal Skinner, MD 04/17/2021, 12:01 PM

## 2021-04-17 NOTE — Op Note (Addendum)
Preoperative diagnosis: history of ruptured appendicitis  Postoperative diagnosis: Same   Procedure: laparoscopic appendectomy  Surgeon: Gurney Maxin, M.D.  Asst: none  Anesthesia: Gen.   Indications for procedure: [name] is a 50 y.o. male with symptoms of pain in right lower quadrant consistent with acute appendicitis. Confirmed by CT. He underwent drainage procedure and has resolved. He presents for interval appendectomy  Description of procedure: The patient was brought into the operative suite, placed supine. Anesthesia was administered with endotracheal tube. The patient's left arm was tucked. All pressure points were offloaded by foam padding. The patient was prepped and draped in the usual sterile fashion.  A transverse incision was made to the left of the umbilicus and a 68mm trocar was [place]. Pneumoperitoneum was applied with high flow low pressure.  2 11mm trocars were placed, one in the suprapubic space, one in the LLQ, the periumbilical incision was then up-sized and a 12mm trocar placed in that space. A transversus abdominal block was placed on the left and right sides. Next, the patient was placed in trendelenberg, rotated to the left. The omentum was retracted cephalad. The cecum and appendix were identified. The appendix was in 2 pieces and densely adhered to the ileum mesentery. Harmonic scalpel was used to dissect the appendix free. During dissection there was a small amount of mucin that drained from the area. This was removed with suction. The appendix body and tip were completely separate from the base. Once completely free, attention was turned toward the base. Additional dissection was done. A 22mm blue load stapler was used to cut the appendix at its base.   The appendix was placed in a specimen bag. The pelvis and RLQ were irrigated. The appendix was removed via the 12 mm trocar. Hemostasis was applied to the dissection area. [name] was placed in the area as well. 0 vicryl  was used to close the fascial defect. Pneumoperitoneum was removed, all trocars were removed. All incisions were closed with 4-0 monocryl subcuticular stitch. The patient woke from anesthesia and was brought to PACU in stable condition.  Findings: The appendix body and tip were completely separate from the base and densely adhered to the ileum. There was some mucin encountered during dissection  Specimen: appendix  Blood loss: 20 ml  Local anesthesia: 30 ml Marcaine  Complications: none  Gurney Maxin, M.D. General, Bariatric, & Minimally Invasive Surgery [facility]

## 2021-04-17 NOTE — Transfer of Care (Signed)
Immediate Anesthesia Transfer of Care Note  Patient: Travis Forbes  Procedure(s) Performed: APPENDECTOMY LAPAROSCOPIC (N/A )  Patient Location: PACU  Anesthesia Type:General  Level of Consciousness: awake and alert   Airway & Oxygen Therapy: Patient Spontanous Breathing and Patient connected to face mask oxygen  Post-op Assessment: Report given to RN and Post -op Vital signs reviewed and stable  Post vital signs: Reviewed and stable  Last Vitals:  Vitals Value Taken Time  BP 142/94 04/17/21 1339  Temp    Pulse 66 04/17/21 1340  Resp 12 04/17/21 1340  SpO2 99 % 04/17/21 1340  Vitals shown include unvalidated device data.  Last Pain:  Vitals:   04/17/21 1057  TempSrc: Oral  PainSc: 0-No pain         Complications: No complications documented.

## 2021-04-17 NOTE — Anesthesia Preprocedure Evaluation (Addendum)
Anesthesia Evaluation  Patient identified by MRN, date of birth, ID band Patient awake    Reviewed: Allergy & Precautions, NPO status , Patient's Chart, lab work & pertinent test results  Airway Mallampati: II  TM Distance: >3 FB Neck ROM: Full    Dental no notable dental hx. (+) Teeth Intact, Dental Advisory Given   Pulmonary former smoker,  Quit smoking 1998   Pulmonary exam normal breath sounds clear to auscultation       Cardiovascular negative cardio ROS Normal cardiovascular exam Rhythm:Regular Rate:Normal     Neuro/Psych negative neurological ROS  negative psych ROS   GI/Hepatic negative GI ROS, (+)     substance abuse  alcohol use, Rosanna Randy syn- no treatments    Endo/Other  Obesity BMI 33  Renal/GU negative Renal ROS  negative genitourinary   Musculoskeletal negative musculoskeletal ROS (+)   Abdominal   Peds  Hematology negative hematology ROS (+) hct 44   Anesthesia Other Findings   Reproductive/Obstetrics negative OB ROS                            Anesthesia Physical Anesthesia Plan  ASA: II  Anesthesia Plan: General   Post-op Pain Management:    Induction: Intravenous  PONV Risk Score and Plan: 2 and Ondansetron, Dexamethasone, Midazolam and Treatment may vary due to age or medical condition  Airway Management Planned: Oral ETT  Additional Equipment: None  Intra-op Plan:   Post-operative Plan: Extubation in OR  Informed Consent: I have reviewed the patients History and Physical, chart, labs and discussed the procedure including the risks, benefits and alternatives for the proposed anesthesia with the patient or authorized representative who has indicated his/her understanding and acceptance.     Dental advisory given  Plan Discussed with: CRNA  Anesthesia Plan Comments:        Anesthesia Quick Evaluation

## 2021-04-21 ENCOUNTER — Encounter (HOSPITAL_COMMUNITY): Payer: Self-pay | Admitting: General Surgery

## 2021-04-21 LAB — SURGICAL PATHOLOGY

## 2021-05-21 ENCOUNTER — Other Ambulatory Visit: Payer: Self-pay

## 2021-05-21 ENCOUNTER — Ambulatory Visit: Payer: Managed Care, Other (non HMO) | Admitting: Internal Medicine

## 2021-05-21 ENCOUNTER — Encounter: Payer: Self-pay | Admitting: Internal Medicine

## 2021-05-21 VITALS — BP 124/84 | HR 64 | Temp 98.1°F | Resp 18 | Ht 68.0 in | Wt 220.0 lb

## 2021-05-21 DIAGNOSIS — E782 Mixed hyperlipidemia: Secondary | ICD-10-CM | POA: Diagnosis not present

## 2021-05-21 DIAGNOSIS — Z1211 Encounter for screening for malignant neoplasm of colon: Secondary | ICD-10-CM

## 2021-05-21 DIAGNOSIS — Z0000 Encounter for general adult medical examination without abnormal findings: Secondary | ICD-10-CM | POA: Diagnosis not present

## 2021-05-21 NOTE — Patient Instructions (Addendum)
We will get you in for the colonoscopy.  Health Maintenance, Male Adopting a healthy lifestyle and getting preventive care are important in promoting health and wellness. Ask your health care provider about:  The right schedule for you to have regular tests and exams.  Things you can do on your own to prevent diseases and keep yourself healthy. What should I know about diet, weight, and exercise? Eat a healthy diet  Eat a diet that includes plenty of vegetables, fruits, low-fat dairy products, and lean protein.  Do not eat a lot of foods that are high in solid fats, added sugars, or sodium.   Maintain a healthy weight Body mass index (BMI) is a measurement that can be used to identify possible weight problems. It estimates body fat based on height and weight. Your health care provider can help determine your BMI and help you achieve or maintain a healthy weight. Get regular exercise Get regular exercise. This is one of the most important things you can do for your health. Most adults should:  Exercise for at least 150 minutes each week. The exercise should increase your heart rate and make you sweat (moderate-intensity exercise).  Do strengthening exercises at least twice a week. This is in addition to the moderate-intensity exercise.  Spend less time sitting. Even light physical activity can be beneficial. Watch cholesterol and blood lipids Have your blood tested for lipids and cholesterol at 49 years of age, then have this test every 5 years. You may need to have your cholesterol levels checked more often if:  Your lipid or cholesterol levels are high.  You are older than 49 years of age.  You are at high risk for heart disease. What should I know about cancer screening? Many types of cancers can be detected early and may often be prevented. Depending on your health history and family history, you may need to have cancer screening at various ages. This may include screening  for:  Colorectal cancer.  Prostate cancer.  Skin cancer.  Lung cancer. What should I know about heart disease, diabetes, and high blood pressure? Blood pressure and heart disease  High blood pressure causes heart disease and increases the risk of stroke. This is more likely to develop in people who have high blood pressure readings, are of African descent, or are overweight.  Talk with your health care provider about your target blood pressure readings.  Have your blood pressure checked: ? Every 3-5 years if you are 49-25 years of age. ? Every year if you are 37 years old or older.  If you are between the ages of 70 and 59 and are a current or former smoker, ask your health care provider if you should have a one-time screening for abdominal aortic aneurysm (AAA). Diabetes Have regular diabetes screenings. This checks your fasting blood sugar level. Have the screening done:  Once every three years after age 60 if you are at a normal weight and have a low risk for diabetes.  More often and at a younger age if you are overweight or have a high risk for diabetes. What should I know about preventing infection? Hepatitis B If you have a higher risk for hepatitis B, you should be screened for this virus. Talk with your health care provider to find out if you are at risk for hepatitis B infection. Hepatitis C Blood testing is recommended for:  Everyone born from [birth year] through 1964.  Anyone with known risk factors for hepatitis C. Sexually  transmitted infections (STIs)  You should be screened each year for STIs, including gonorrhea and chlamydia, if: ? You are sexually active and are younger than 49 years of age. ? You are older than 49 years of age and your health care provider tells you that you are at risk for this type of infection. ? Your sexual activity has changed since you were last screened, and you are at increased risk for chlamydia or gonorrhea. Ask your health care  provider if you are at risk.  Ask your health care provider about whether you are at high risk for HIV. Your health care provider may recommend a prescription medicine to help prevent HIV infection. If you choose to take medicine to prevent HIV, you should first get tested for HIV. You should then be tested every 3 months for as long as you are taking the medicine. Follow these instructions at home: Lifestyle  Do not use any products that contain nicotine or tobacco, such as cigarettes, e-cigarettes, and chewing tobacco. If you need help quitting, ask your health care provider.  Do not use street drugs.  Do not share needles.  Ask your health care provider for help if you need support or information about quitting drugs. Alcohol use  Do not drink alcohol if your health care provider tells you not to drink.  If you drink alcohol: ? Limit how much you have to 0-2 drinks a day. ? Be aware of how much alcohol is in your drink. In the [place] one drink equals one 12 oz bottle of beer (355 mL), one 5 oz glass of wine (148 mL), or one 1 oz glass of hard liquor (44 mL). General instructions  Schedule regular health, dental, and eye exams.  Stay current with your vaccines.  Tell your health care provider if: ? You often feel depressed. ? You have ever been abused or do not feel safe at home. Summary  Adopting a healthy lifestyle and getting preventive care are important in promoting health and wellness.  Follow your health care provider's instructions about healthy diet, exercising, and getting tested or screened for diseases.  Follow your health care provider's instructions on monitoring your cholesterol and blood pressure. This information is not intended to replace advice given to you by your health care provider. Make sure you discuss any questions you have with your health care provider. Document Revised: 11/23/2018 Document Reviewed: 11/23/2018 Elsevier Patient Education  2021  [employer].

## 2021-05-21 NOTE — Progress Notes (Signed)
   Subjective:   Patient ID: Travis Forbes, male    DOB: 27-Mar-1971, 50 y.o.   MRN: [id number]  HPI The patient is a new 50 YO man coming in for physical.   PMH, [place], social history reviewed and updated  Review of Systems  Constitutional: Negative.   HENT: Negative.    Eyes: Negative.   Respiratory:  Negative for cough, chest tightness and shortness of breath.   Cardiovascular:  Negative for chest pain, palpitations and leg swelling.  Gastrointestinal:  Negative for abdominal distention, abdominal pain, constipation, diarrhea, nausea and vomiting.  Musculoskeletal: Negative.   Skin: Negative.   Neurological: Negative.   Psychiatric/Behavioral: Negative.     Objective:  Physical Exam Constitutional:      Appearance: He is well-developed.  HENT:     Head: Normocephalic and atraumatic.  Cardiovascular:     Rate and Rhythm: Normal rate and regular rhythm.  Pulmonary:     Effort: Pulmonary effort is normal. No respiratory distress.     Breath sounds: Normal breath sounds. No wheezing or rales.  Abdominal:     General: Bowel sounds are normal. There is no distension.     Palpations: Abdomen is soft.     Tenderness: There is no abdominal tenderness. There is no rebound.  Musculoskeletal:     Cervical back: Normal range of motion.  Skin:    General: Skin is warm and dry.  Neurological:     Mental Status: He is alert and oriented to person, place, and time.     Coordination: Coordination normal.    Vitals:   05/21/21 0903  BP: 124/84  Pulse: 64  Resp: 18  Temp: 98.1 F (36.7 C)  TempSrc: Oral  SpO2: 99%  Weight: 220 lb (99.8 kg)  Height: 5\' 8"  (1.727 m)    This visit occurred during the SARS-CoV-2 public health emergency.  Safety protocols were in place, including screening questions prior to the visit, additional usage of staff PPE, and extensive cleaning of exam room while observing appropriate contact time as indicated for disinfecting solutions.   Assessment &  Plan:

## 2021-05-22 DIAGNOSIS — Z0000 Encounter for general adult medical examination without abnormal findings: Secondary | ICD-10-CM | POA: Insufficient documentation

## 2021-05-22 NOTE — Assessment & Plan Note (Signed)
Diet controlled and he is getting back into exercise after appendix surgery.

## 2021-05-22 NOTE — Assessment & Plan Note (Signed)
Flu shot yearly. Covid-19 up to date. Tetanus declines. Colonoscopy referral placed with appendix removal needs colonoscopy. Counseled about sun safety and mole surveillance. Counseled about the dangers of distracted driving. Given 10 year screening recommendations.

## 2021-05-22 NOTE — Assessment & Plan Note (Signed)
Stable mildly elevated bilirubin level.

## 2021-12-05 ENCOUNTER — Encounter: Payer: Self-pay | Admitting: Gastroenterology

## 2022-01-06 ENCOUNTER — Other Ambulatory Visit: Payer: Self-pay

## 2022-01-06 ENCOUNTER — Ambulatory Visit (AMBULATORY_SURGERY_CENTER): Payer: Managed Care, Other (non HMO)

## 2022-01-06 VITALS — Ht 68.0 in | Wt 210.0 lb

## 2022-01-06 DIAGNOSIS — Z1211 Encounter for screening for malignant neoplasm of colon: Secondary | ICD-10-CM

## 2022-01-06 MED ORDER — NA SULFATE-K SULFATE-MG SULF 17.5-3.13-1.6 GM/177ML PO SOLN: 1.0000 | Freq: Once | ORAL | 0 refills | Status: AC

## 2022-01-06 NOTE — Progress Notes (Signed)

## 2022-01-12 ENCOUNTER — Encounter: Payer: Self-pay | Admitting: Gastroenterology

## 2022-01-16 ENCOUNTER — Other Ambulatory Visit: Payer: Self-pay

## 2022-01-16 ENCOUNTER — Ambulatory Visit (AMBULATORY_SURGERY_CENTER): Payer: Managed Care, Other (non HMO) | Admitting: Gastroenterology

## 2022-01-16 ENCOUNTER — Encounter: Payer: Self-pay | Admitting: Gastroenterology

## 2022-01-16 VITALS — BP 130/86 | HR 54 | Temp 97.8°F | Resp 13 | Ht 68.0 in | Wt 210.0 lb

## 2022-01-16 DIAGNOSIS — Z1211 Encounter for screening for malignant neoplasm of colon: Secondary | ICD-10-CM

## 2022-01-16 DIAGNOSIS — D124 Benign neoplasm of descending colon: Secondary | ICD-10-CM | POA: Diagnosis not present

## 2022-01-16 DIAGNOSIS — D123 Benign neoplasm of transverse colon: Secondary | ICD-10-CM

## 2022-01-16 DIAGNOSIS — D122 Benign neoplasm of ascending colon: Secondary | ICD-10-CM | POA: Diagnosis not present

## 2022-01-16 HISTORY — PX: COLONOSCOPY: SHX174

## 2022-01-16 MED ORDER — SODIUM CHLORIDE 0.9 % IV SOLN: 500.0000 mL | Freq: Once | INTRAVENOUS | Status: DC

## 2022-01-16 NOTE — Progress Notes (Signed)
Pt's states no medical or surgical changes since previsit or office visit.  Vitals DT 

## 2022-01-16 NOTE — Op Note (Signed)
[facility] Patient Name: Travis Forbes Procedure Date: 01/16/2022 9:39 AM MRN: [id number] Endoscopist: Kaylor. Loletha Carrow , MD Age: 51 Referring MD:  Date of Birth: 11/09/1971 Gender: Male Account #: [id number] Procedure:                Colonoscopy Indications:              Screening for colorectal malignant neoplasm, This                            is the patient's first colonoscopy Medicines:                Monitored Anesthesia Care Procedure:                Pre-Anesthesia Assessment:                           - Prior to the procedure, a History and Physical                            was performed, and patient medications and                            allergies were reviewed. The patient's tolerance of                            previous anesthesia was also reviewed. The risks                            and benefits of the procedure and the sedation                            options and risks were discussed with the patient.                            All questions were answered, and informed consent                            was obtained. Prior Anticoagulants: The patient has                            taken no previous anticoagulant or antiplatelet                            agents. ASA Grade Assessment: II - A patient with                            mild systemic disease. After reviewing the risks                            and benefits, the patient was deemed in                            satisfactory condition to undergo the procedure.  After obtaining informed consent, the colonoscope                            was passed under direct vision. Throughout the                            procedure, the patient's blood pressure, pulse, and                            oxygen saturations were monitored continuously. The                            CF [id number] #[id number] was introduced through the anus                            and advanced to the the  terminal ileum, with                            identification of the appendiceal orifice and IC                            valve. The colonoscopy was performed without                            difficulty. The patient tolerated the procedure                            well. The quality of the bowel preparation was                            good. The terminal ileum, ileocecal valve,                            appendiceal orifice, and rectum were photographed.                            The bowel preparation used was SUPREP. Scope In: 9:48:55 AM Scope Out: 10:05:25 AM Scope Withdrawal Time: 0 hours 14 minutes 19 seconds  Total Procedure Duration: 0 hours 16 minutes 30 seconds  Findings:                 The perianal and digital rectal examinations were                            normal.                           Repeat examination of right colon under NBI                            performed.                           Two semi-sessile polyps were found in the ascending  colon. The polyps were diminutive in size. These                            polyps were removed with a cold biopsy forceps.                            Resection and retrieval were complete. (Jar 1)                           Two sessile polyps were found in the transverse                            colon. The polyps were 4 mm in size. These polyps                            were removed with a cold snare. Resection and                            retrieval were complete. (Jar 1)                           Two sessile polyps were found in the distal                            descending colon. The polyps were 6 mm in size.                            These polyps were removed with a cold snare.                            Resection and retrieval were complete. (Jar 2)                           Scattered small-mouthed diverticula were found in                            the entire colon.                            Internal hemorrhoids were found.                           The exam was otherwise without abnormality on                            direct and retroflexion views. Complications:            No immediate complications. Estimated Blood Loss:     Estimated blood loss was minimal. Impression:               - Two diminutive polyps in the ascending colon,                            removed with a cold biopsy forceps. Resected and  retrieved.                           - Two 4 mm polyps in the transverse colon, removed                            with a cold snare. Resected and retrieved.                           - Two 6 mm polyps in the distal descending colon,                            removed with a cold snare. Resected and retrieved.                           - Diverticulosis in the entire examined colon.                           - Internal hemorrhoids.                           - The examination was otherwise normal on direct                            and retroflexion views. Recommendation:           - Patient has a contact number available for                            emergencies. The signs and symptoms of potential                            delayed complications were discussed with the                            patient. Return to normal activities tomorrow.                            Written discharge instructions were provided to the                            patient.                           - Resume previous diet.                           - Continue present medications.                           - Await pathology results.                           - Repeat colonoscopy is recommended for                            surveillance. The  colonoscopy date will be                            determined after pathology results from today's                            exam become available for review. Travis Forbes L. Loletha Carrow, MD 01/16/2022 10:16:34 AM This  report has been signed electronically.

## 2022-01-16 NOTE — Patient Instructions (Signed)
Handouts on polyps, diverticulosis, and hemorrhoids given.  YOU HAD AN ENDOSCOPIC PROCEDURE TODAY AT [facility]:   Refer to the procedure report that was given to you for any specific questions about what was found during the examination.  If the procedure report does not answer your questions, please call your gastroenterologist to clarify.  If you requested that your care partner not be given the details of your procedure findings, then the procedure report has been included in a sealed envelope for you to review at your convenience later.  YOU SHOULD EXPECT: Some feelings of bloating in the abdomen. Passage of more gas than usual.  Walking can help get rid of the air that was put into your GI tract during the procedure and reduce the bloating. If you had a lower endoscopy (such as a colonoscopy or flexible sigmoidoscopy) you may notice spotting of blood in your stool or on the toilet paper. If you underwent a bowel prep for your procedure, you may not have a normal bowel movement for a few days.  Please Note:  You might notice some irritation and congestion in your nose or some drainage.  This is from the oxygen used during your procedure.  There is no need for concern and it should clear up in a day or so.  SYMPTOMS TO REPORT IMMEDIATELY:  Following lower endoscopy (colonoscopy or flexible sigmoidoscopy):  Excessive amounts of blood in the stool  Significant tenderness or worsening of abdominal pains  Swelling of the abdomen that is new, acute  Fever of 100F or higher  For urgent or emergent issues, a gastroenterologist can be reached at any hour by calling [phone]. Do not use MyChart messaging for urgent concerns.    DIET:  We do recommend a small meal at first, but then you may proceed to your regular diet.  Drink plenty of fluids but you should avoid alcoholic beverages for 24 hours.  ACTIVITY:  You should plan to take it easy for the rest of today and you  should NOT DRIVE or use heavy machinery until tomorrow (because of the sedation medicines used during the test).    FOLLOW UP: Our staff will call the number listed on your records 48-72 hours following your procedure to check on you and address any questions or concerns that you may have regarding the information given to you following your procedure. If we do not reach you, we will leave a message.  We will attempt to reach you two times.  During this call, we will ask if you have developed any symptoms of COVID 19. If you develop any symptoms (ie: fever, flu-like symptoms, shortness of breath, cough etc.) before then, please call [phone].  If you test positive for Covid 19 in the 2 weeks post procedure, please call and report this information to [place].    If any biopsies were taken you will be contacted by phone or by letter within the next 1-3 weeks.  Please call us at [phone] if you have not heard about the biopsies in 3 weeks.    SIGNATURES/CONFIDENTIALITY: You and/or your care partner have signed paperwork which will be entered into your electronic medical record.  These signatures attest to the fact that that the information above on your After Visit Summary has been reviewed and is understood.  Full responsibility of the confidentiality of this discharge information lies with you and/or your care-partner.

## 2022-01-16 NOTE — Progress Notes (Signed)
History and Physical:  This patient presents for endoscopic testing for: Encounter Diagnosis  Name Primary?   Special screening for malignant neoplasms, colon Yes    First screening exam Patient denies chronic abdominal pain, rectal bleeding, constipation or diarrhea.   ROS: Patient denies chest pain or cough   Past Medical History: Past Medical History:  Diagnosis Date   Rosanna Randy syndrome    Perennial allergic rhinitis with seasonal variation      Past Surgical History: Past Surgical History:  Procedure Laterality Date   APPENDECTOMY     COLONOSCOPY  01/16/2022   IR RADIOLOGIST EVAL & MGMT  02/18/2021   LAPAROSCOPIC APPENDECTOMY N/A 04/17/2021   Procedure: APPENDECTOMY LAPAROSCOPIC;  Surgeon: Mickeal Skinner, MD;  Location: [facility];  Service: General;  Laterality: N/A;   WISDOM TOOTH EXTRACTION      Allergies: No Known Allergies  Outpatient Meds: Current Outpatient Medications  Medication Sig Dispense Refill   Ascorbic Acid (VITAMIN C PO) Take 1 tablet by mouth daily.     [employer] Fiber Gummy Bears Children CHEW Chew by mouth.     milk thistle 175 MG tablet Take 175 mg by mouth daily.     Multiple Vitamin (MULTIVITAMIN WITH MINERALS) TABS tablet Take 1 tablet by mouth daily.     cetirizine (ZYRTEC) 10 MG tablet Take 10 mg by mouth daily.     ibuprofen (ADVIL) 800 MG tablet Take 1 tablet (800 mg total) by mouth every 8 (eight) hours as needed. 30 tablet 0   Current Facility-Administered Medications  Medication Dose Route Frequency Provider Last Rate Last Admin   0.9 %  sodium chloride infusion  500 mL Intravenous Once Doran Stabler, MD          ___________________________________________________________________ Objective   Exam:  BP 124/76    Pulse 68    Temp 97.8 F (36.6 C) (Temporal)    Ht 5\' 8"  (1.727 m)    Wt 210 lb (95.3 kg)    SpO2 100%    BMI 31.93 kg/m   CV: RRR without murmur, S1/S2 Resp: clear to auscultation bilaterally, normal RR and  effort noted GI: soft, no tenderness, with active bowel sounds.   Assessment: Encounter Diagnosis  Name Primary?   Special screening for malignant neoplasms, colon Yes     Plan: Colonoscopy  The benefits and risks of the planned procedure were described in detail with the patient or (when appropriate) their health care proxy.  Risks were outlined as including, but not limited to, bleeding, infection, perforation, adverse medication reaction leading to cardiac or pulmonary decompensation, pancreatitis (if ERCP).  The limitation of incomplete mucosal visualization was also discussed.  No guarantees or warranties were given.    The patient is appropriate for an endoscopic procedure in the ambulatory setting.   - Wilfrid Lund, MD

## 2022-01-16 NOTE — Progress Notes (Signed)
Called to room to assist during endoscopic procedure.  Patient ID and intended procedure confirmed with present staff. Received instructions for my participation in the procedure from the performing physician.  

## 2022-01-16 NOTE — Progress Notes (Signed)
Pt non-responsive, VVS, Report to RN  °

## 2022-01-20 ENCOUNTER — Telehealth: Payer: Self-pay

## 2022-01-20 NOTE — Telephone Encounter (Signed)
°  Follow up Call-  Call back number 01/16/2022  Post procedure Call Back phone  # [phone]  Permission to leave phone message Yes  Some recent data might be hidden     Patient questions:  Do you have a fever, pain , or abdominal swelling? No. Pain Score  0 *  Have you tolerated food without any problems? Yes.    Have you been able to return to your normal activities? Yes.    Do you have any questions about your discharge instructions: Diet   No. Medications  No. Follow up visit  No.  Do you have questions or concerns about your Care? No.  Actions: * If pain score is 4 or above: No action needed, pain <4.

## 2022-01-22 ENCOUNTER — Encounter: Payer: Self-pay | Admitting: Gastroenterology

## 2022-04-20 IMAGING — CT CT ABD-PELV W/ CM
2 of 5 series · 15 of 46 positions shown, 17 images · IV contrast (Omni 300)
Comparison: None

CLINICAL DATA: Abdominal pain for 2 weeks

EXAM:
CT ABDOMEN AND PELVIS WITH CONTRAST
TECHNIQUE: Multidetector CT imaging of the abdomen and pelvis was performed
using the standard protocol following bolus administration of
intravenous contrast. Sagittal and coronal MPR images reconstructed
from axial data set.
CONTRAST:  100mL OMNIPAQUE IOHEXOL 300 MG/ML SOLN IV. No oral
contrast.

[Series 3: a/p w/ 5mm · axial · 0.98mm/px · z∈[+918,+1448]mm · 12 of 120 slices shown, 14 images]
[im 7/120  soft-tissue]
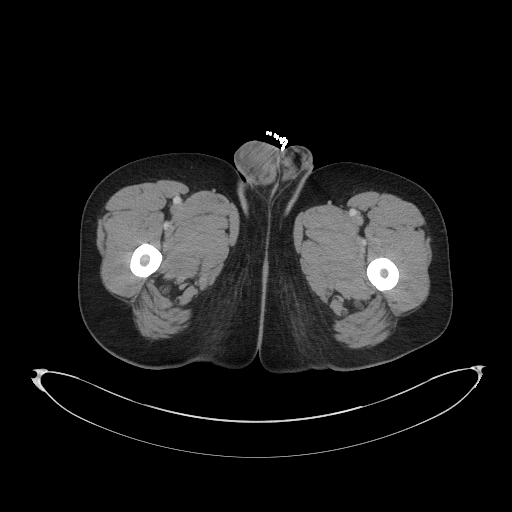
[im 7/120  bone]
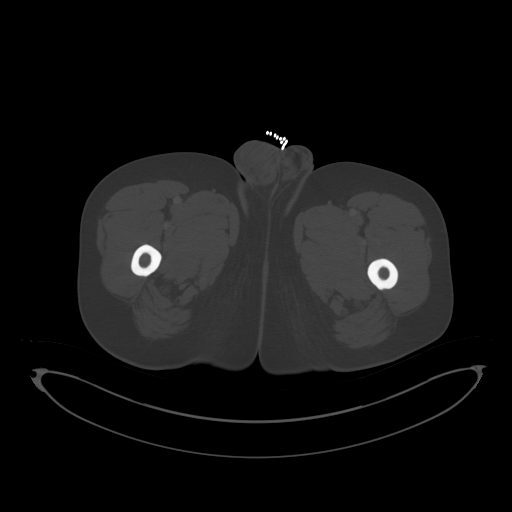
[im 19/120  soft-tissue]
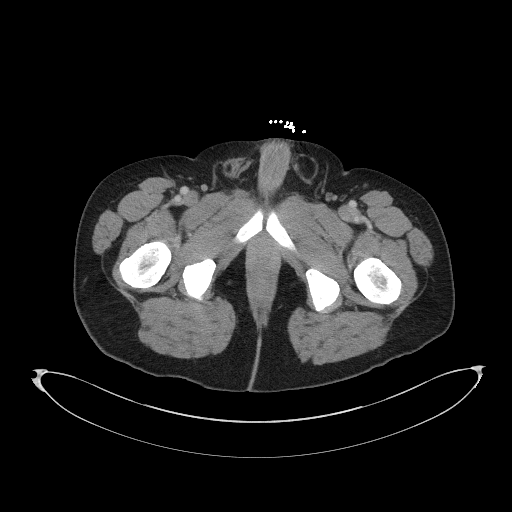
[im 26/120  soft-tissue]
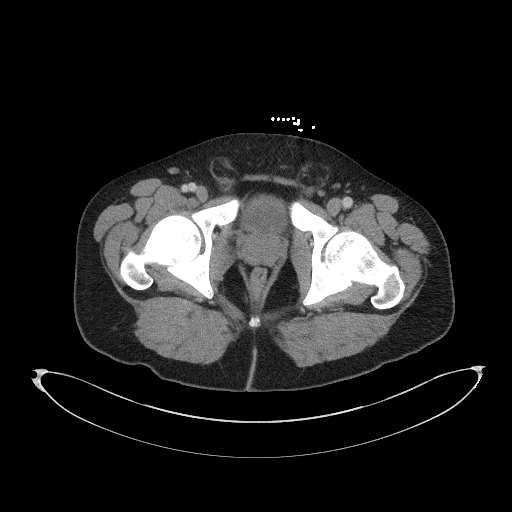
[im 38/120  soft-tissue]
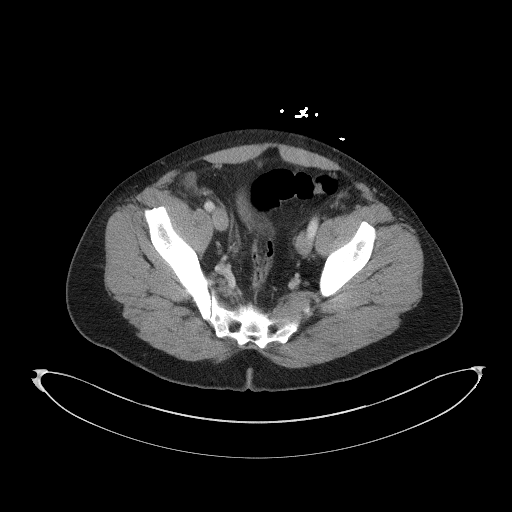
[im 44/120  soft-tissue]
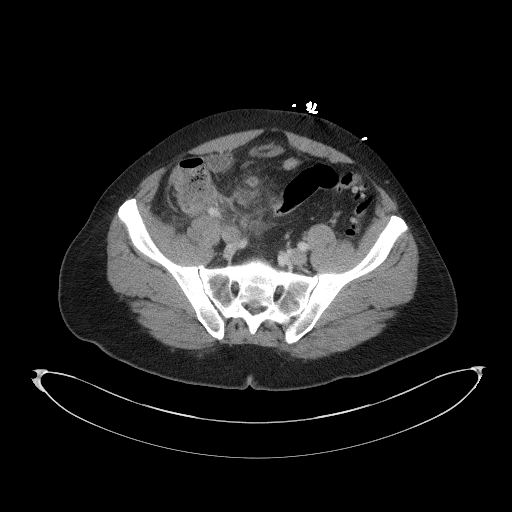
[im 57/120  soft-tissue]
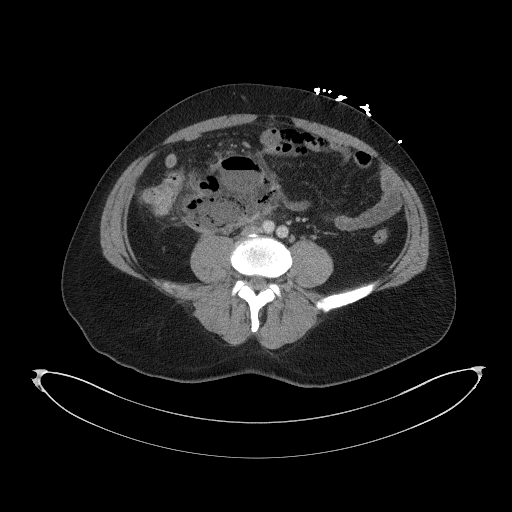
[im 63/120  soft-tissue]
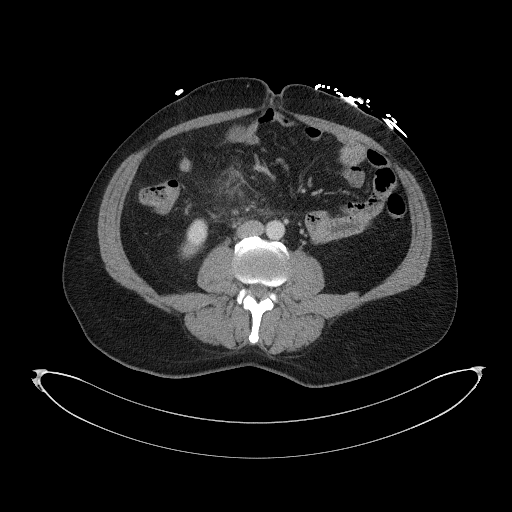
[im 76/120  soft-tissue]
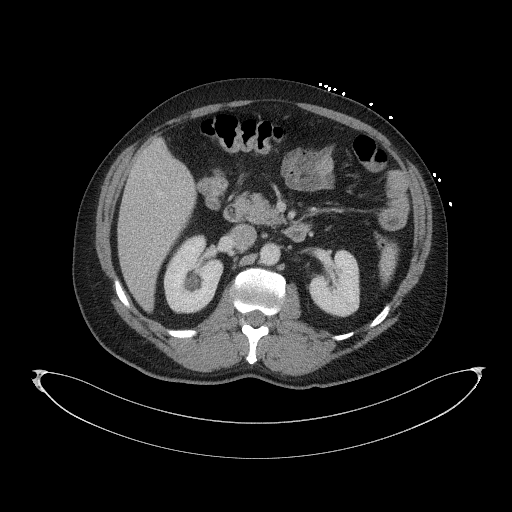
[im 82/120  soft-tissue]
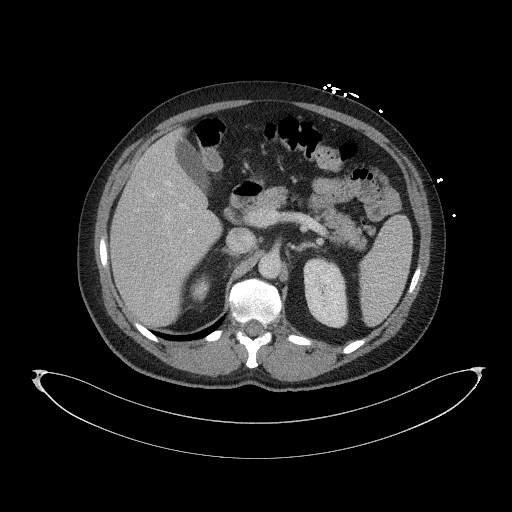
[im 82/120  bone]
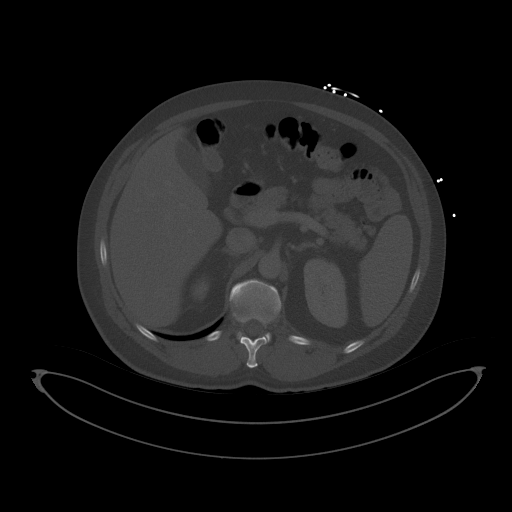
[im 94/120  soft-tissue]
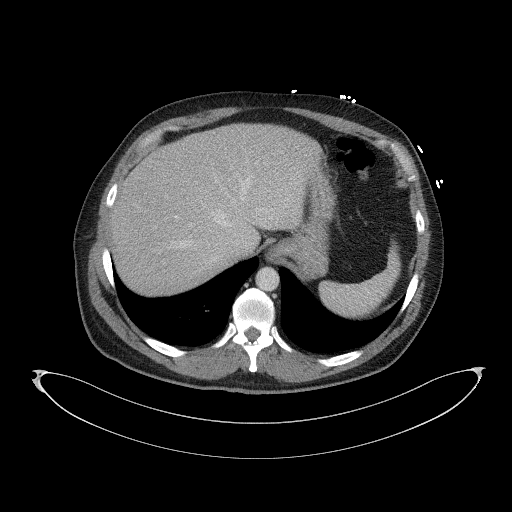
[im 101/120  soft-tissue]
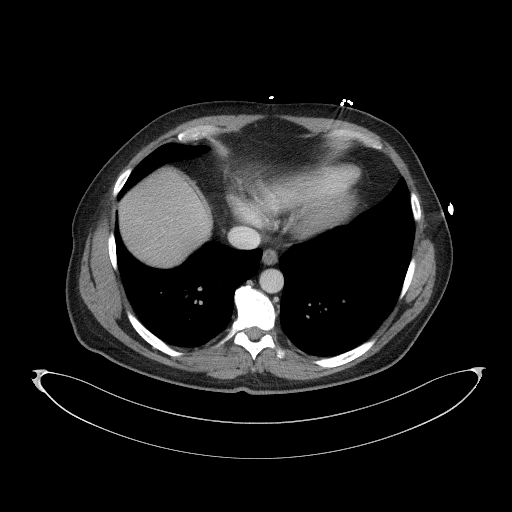
[im 113/120  soft-tissue]
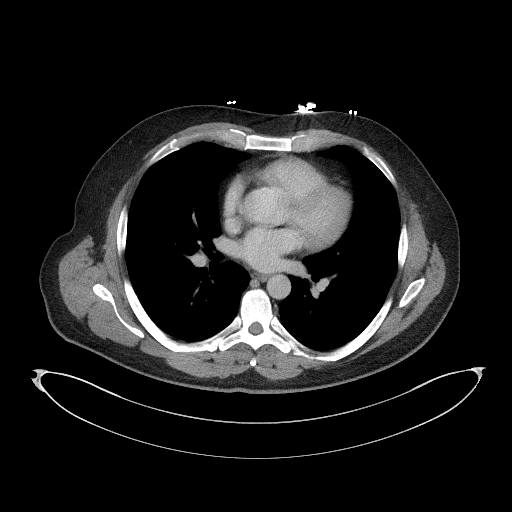

[Series 6: a/p w/ cor · coronal · 1.07mm/px · 3 of 178 slices shown]
[im 60/178  soft-tissue]
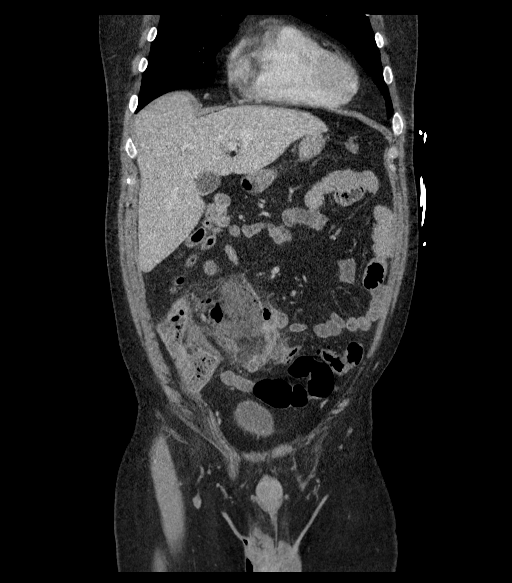
[im 79/178  soft-tissue]
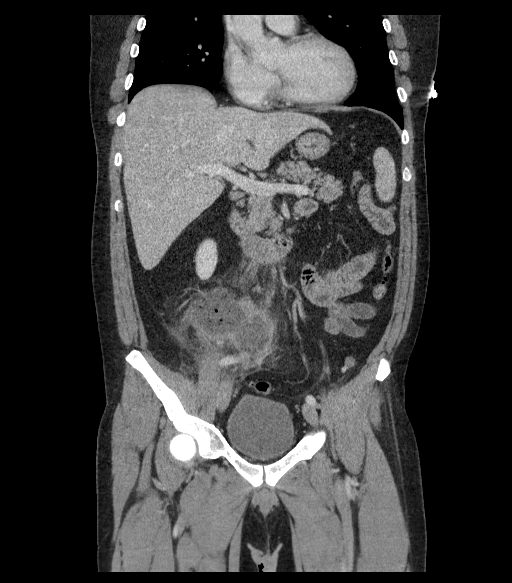
[im 99/178  soft-tissue]
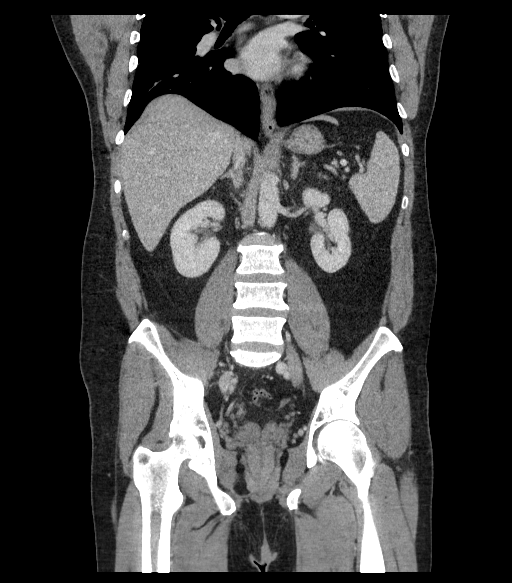

[15 of 46 positions shown; findings below may reference images not displayed]

FINDINGS: Lower chest: Lung bases clear

Hepatobiliary: Gallbladder and liver normal appearance

Pancreas: Normal appearance.

Spleen: Normal appearance.  Small splenule anterior to spleen.

Adrenals/Urinary Tract: Mild dilatation of RIGHT renal collecting
system and proximal RIGHT ureter with decompressed distal RIGHT
ureter, likely due to process in RIGHT mid abdomen, see below.
Adrenal glands, kidneys, ureters, and bladder otherwise normal
appearance.

Stomach/Bowel: Large inflammatory process identified medial to the
cecum at proximal ascending colon measuring 9.5 x 8.6 x 9.4 cm.
Collection contains air, fluid, and surrounding inflammatory
changes. A portion of the margin is well-defined enhancing while
additional areas are less well-defined, with scattered edema and
soft tissue gas. A single small calcification is seen within the
collection 5 mm diameter which could represent an appendicolith. The
appendix is not well identified within this collection/area. Distal
colonic diverticulosis involving descending and sigmoid colon the
inflammatory process does abut the sigmoid colon though only minimal
colonic wall thickening is identified. The epicenter of the process
appears to be centered medial to the cecum rather than at the
sigmoid colon. Findings most favor perforated appendicitis rather
than diverticulitis.

Vascular/Lymphatic: Vascular structures patent.  No adenopathy.

Reproductive: Unremarkable prostate gland and seminal vesicles

Other: BILATERAL inguinal hernias containing fat. No free air or
free fluid. Tiny umbilical hernia containing fat.

Musculoskeletal: Unremarkable
IMPRESSION: Large inflammatory process medial to the cecum 9.5 x 8.6 x 9.4 cm in
size containing fluid, gas, and surrounding inflammatory changes.

This likely represents perforated appendicitis with a large phlegmon
and developing abscess though this is not entirely
well-circumscribed; while this does abut the sigmoid colon which has
evidence of diverticulosis, the epicenter of the inflammatory
process is more centered in the RIGHT mid abdomen is more consistent
with perforated appendicitis than perforated diverticulitis.

Appendix is poorly localized within this collection.

No free air or free fluid.

BILATERAL inguinal umbilical hernias containing fat.

Findings called to Karianyelis Daleman PA on 02/01/2021 at 6719 hours.

## 2022-05-07 IMAGING — RF DG SINUS / FISTULA TRACT / ABSCESSOGRAM
3 series · 6 of 6 positions shown · non-contrast
Comparison: none

INDICATION: Acute appendicitis with perforation, status post abscess drain

[Series 1: one shot · 1 of 1 slices shown (1 of 2)]
[im 1/1]
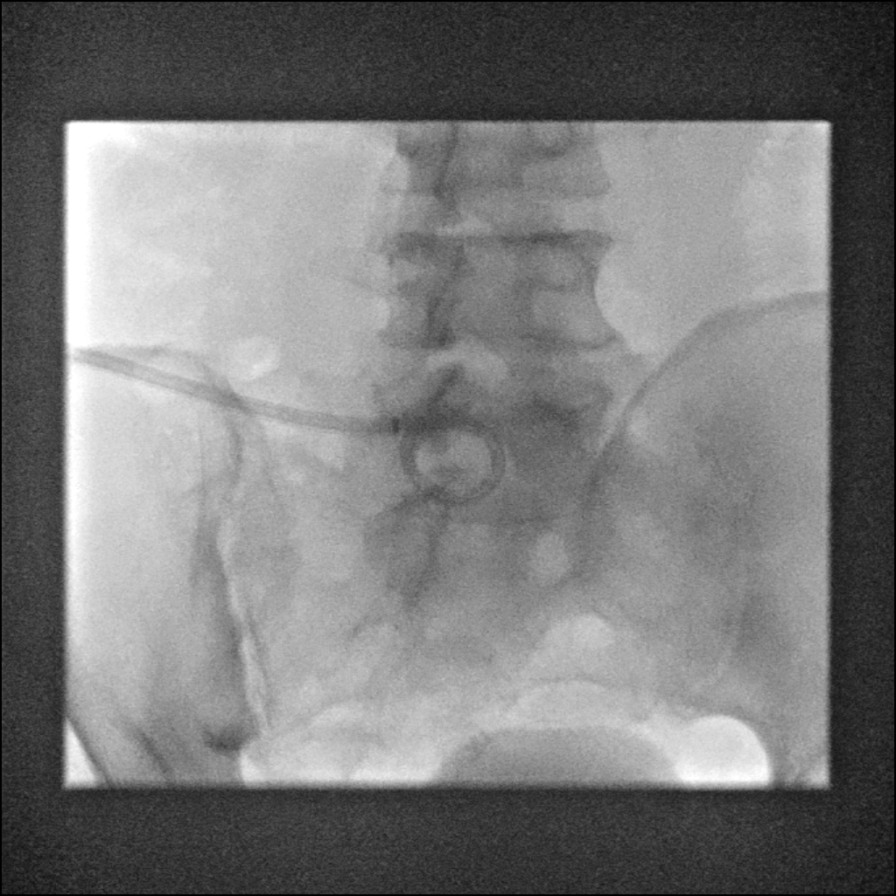

[Series 2: sequence · 4 of 145 frames shown]
[frame 14/145]
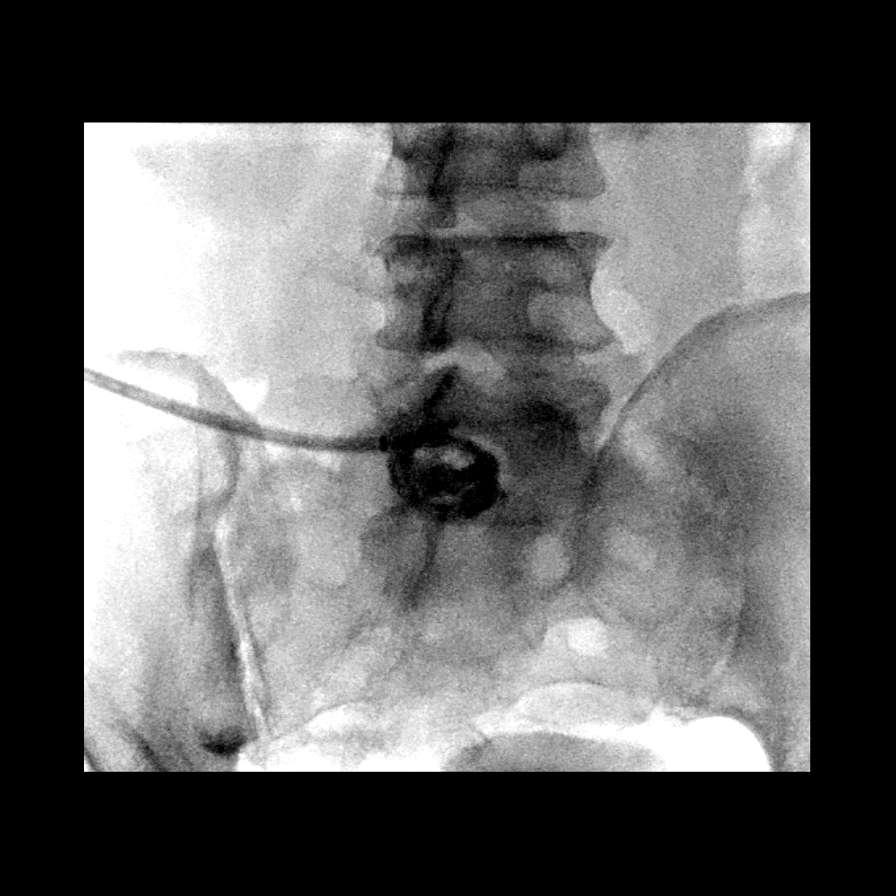
[frame 22/145]
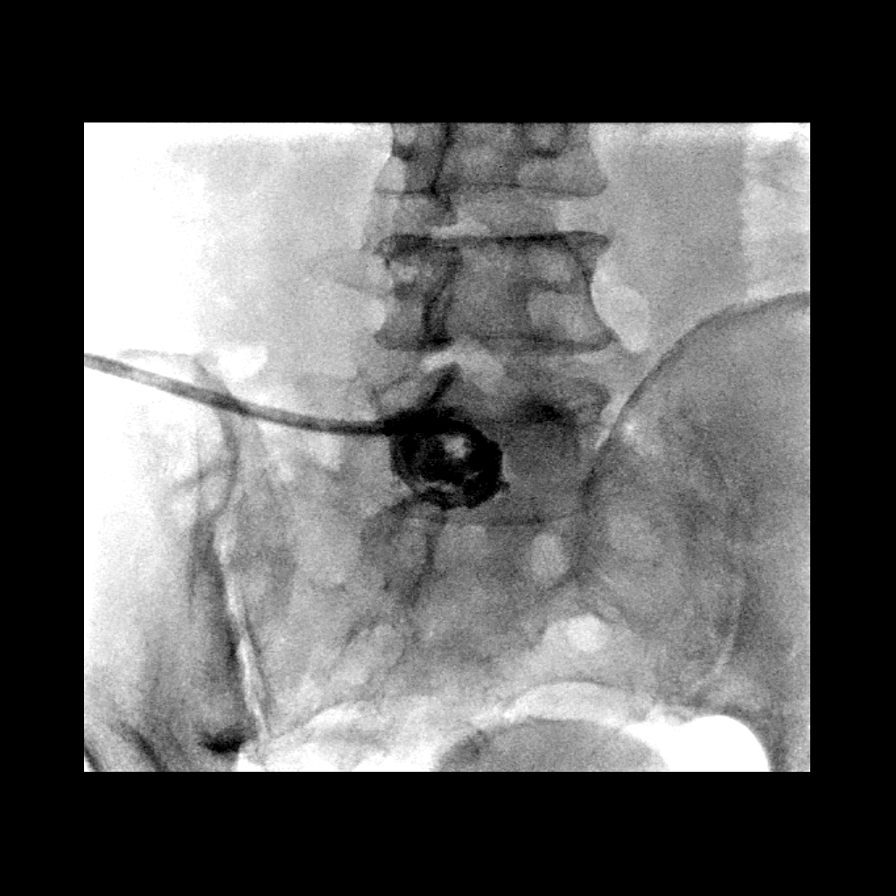
[frame 73/145]
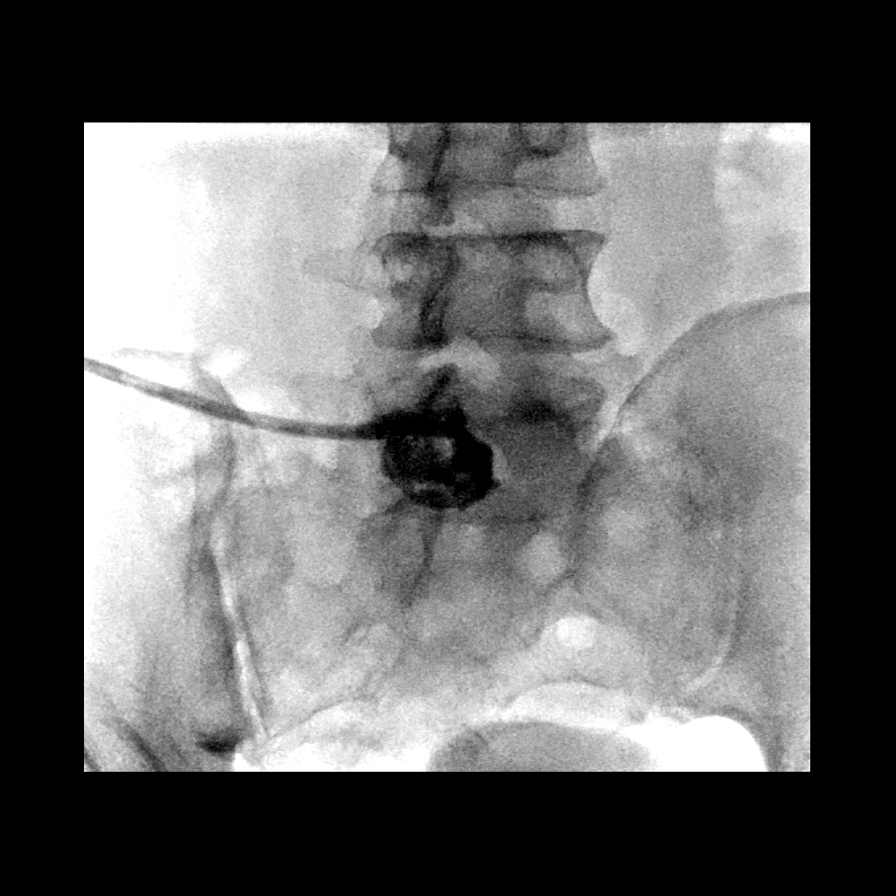
[frame 124/145]
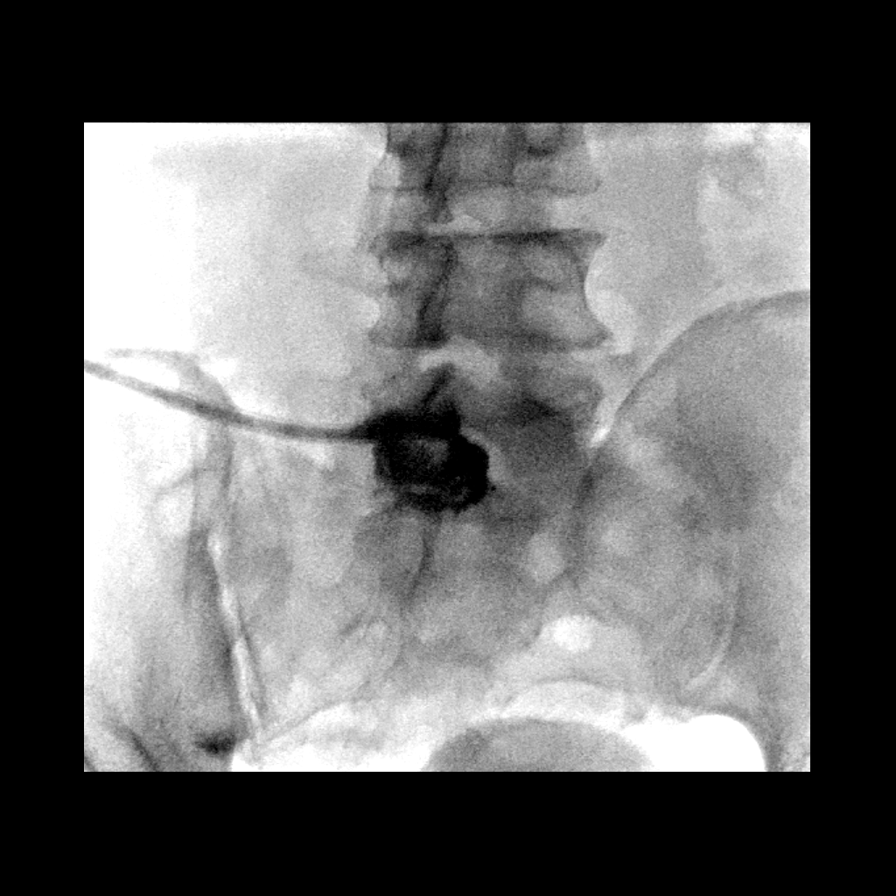

[Series 3: one shot · 1 of 1 slices shown (2 of 2)]
[im 1/1]
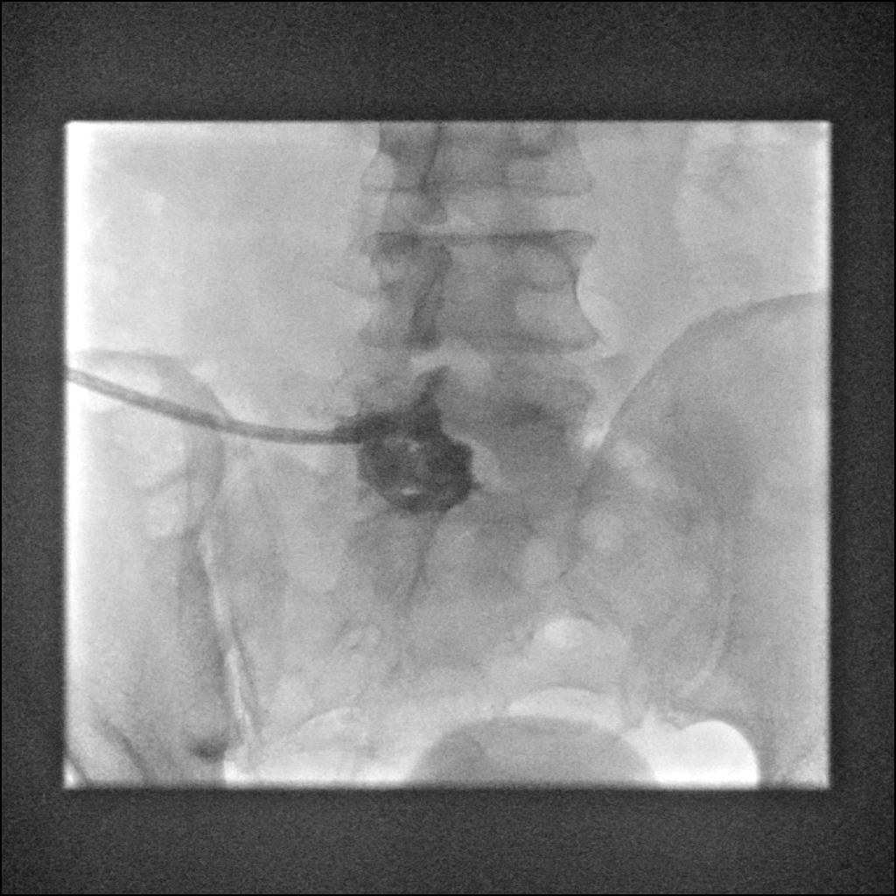

[6 of 6 positions shown; findings below may reference images not displayed]

EXAM:
FLUOROSCOPIC ABSCESS DRAIN INJECTION

MEDICATIONS:
None.

ANESTHESIA/SEDATION:
Moderate Sedation Time:  None.

The patient was continuously monitored during the procedure by the
interventional radiology nurse under my direct supervision.

COMPLICATIONS:
None immediate.

PROCEDURE:
Right lower quadrant abscess drain was injected with contrast.
Fluoroscopic imaging performed.

Stable drain catheter position. Abscess cavity is collapsed.
Negative for fistula to adjacent bowel.
IMPRESSION: Collapsed abscess cavity.  Negative for fistula.

## 2022-05-25 ENCOUNTER — Encounter: Payer: Managed Care, Other (non HMO) | Admitting: Internal Medicine

## 2022-06-08 ENCOUNTER — Ambulatory Visit (INDEPENDENT_AMBULATORY_CARE_PROVIDER_SITE_OTHER): Payer: Managed Care, Other (non HMO) | Admitting: Internal Medicine

## 2022-06-08 ENCOUNTER — Encounter: Payer: Self-pay | Admitting: Internal Medicine

## 2022-06-08 VITALS — BP 120/70 | HR 79 | Resp 18 | Ht 68.0 in | Wt 224.2 lb

## 2022-06-08 DIAGNOSIS — E669 Obesity, unspecified: Secondary | ICD-10-CM

## 2022-06-08 DIAGNOSIS — E782 Mixed hyperlipidemia: Secondary | ICD-10-CM

## 2022-06-08 DIAGNOSIS — Z136 Encounter for screening for cardiovascular disorders: Secondary | ICD-10-CM | POA: Diagnosis not present

## 2022-06-08 DIAGNOSIS — Z0000 Encounter for general adult medical examination without abnormal findings: Secondary | ICD-10-CM | POA: Diagnosis not present

## 2022-06-08 LAB — COMPREHENSIVE METABOLIC PANEL
ALT: 34 U/L (ref 0–53)
AST: 24 U/L (ref 0–37)
Albumin: 4.8 g/dL (ref 3.5–5.2)
Alkaline Phosphatase: 55 U/L (ref 39–117)
BUN: 16 mg/dL (ref 6–23)
CO2: 28 mEq/L (ref 19–32)
Calcium: 9.8 mg/dL (ref 8.4–10.5)
Chloride: 102 mEq/L (ref 96–112)
Creatinine, Ser: 0.95 mg/dL (ref 0.40–1.50)
GFR: 93.6 mL/min (ref >60.00)
Glucose, Bld: 96 mg/dL (ref 70–99)
Potassium: 3.9 mEq/L (ref 3.5–5.1)
Sodium: 139 mEq/L (ref 135–145)
Total Bilirubin: 1.3 mg/dL — ABNORMAL HIGH (ref 0.2–1.2)
Total Protein: 7.1 g/dL (ref 6.0–8.3)

## 2022-06-08 LAB — LIPID PANEL
Cholesterol: 220 mg/dL — ABNORMAL HIGH (ref 0–200)
HDL: 57.9 mg/dL (ref >39.00)
NonHDL: 161.62
Total CHOL/HDL Ratio: 4
Triglycerides: 211 mg/dL — ABNORMAL HIGH (ref 0.0–149.0)
VLDL: 42.2 mg/dL — ABNORMAL HIGH (ref 0.0–40.0)

## 2022-06-08 LAB — CBC
HCT: 41.2 % (ref 39.0–52.0)
Hemoglobin: 14.2 g/dL (ref 13.0–17.0)
MCHC: 34.3 g/dL (ref 30.0–36.0)
MCV: 91.5 fl (ref 78.0–100.0)
Platelets: 228 10*3/uL (ref 150.0–400.0)
RBC: 4.51 Mil/uL (ref 4.22–5.81)
RDW: 12.6 % (ref 11.5–15.5)
WBC: 6.6 10*3/uL (ref 4.0–10.5)

## 2022-06-08 LAB — LDL CHOLESTEROL, DIRECT: Direct LDL: 137 mg/dL

## 2022-06-08 NOTE — Progress Notes (Signed)
   Subjective:   Patient ID: Travis Forbes, male    DOB: 1971/09/26, 51 y.o.   MRN: [id number]  HPI The patient is a 51 YO man coming in for physical.   PMH, [facility], social history reviewed and updated  Review of Systems  Constitutional: Negative.   HENT: Negative.    Eyes: Negative.   Respiratory:  Negative for cough, chest tightness and shortness of breath.   Cardiovascular:  Negative for chest pain, palpitations and leg swelling.  Gastrointestinal:  Negative for abdominal distention, abdominal pain, constipation, diarrhea, nausea and vomiting.  Musculoskeletal: Negative.   Skin: Negative.   Neurological: Negative.   Psychiatric/Behavioral: Negative.      Objective:  Physical Exam Constitutional:      Appearance: He is well-developed.  HENT:     Head: Normocephalic and atraumatic.  Cardiovascular:     Rate and Rhythm: Normal rate and regular rhythm.  Pulmonary:     Effort: Pulmonary effort is normal. No respiratory distress.     Breath sounds: Normal breath sounds. No wheezing or rales.  Abdominal:     General: Bowel sounds are normal. There is no distension.     Palpations: Abdomen is soft.     Tenderness: There is no abdominal tenderness. There is no rebound.  Musculoskeletal:     Cervical back: Normal range of motion.  Skin:    General: Skin is warm and dry.  Neurological:     Mental Status: He is alert and oriented to person, place, and time.     Coordination: Coordination normal.     Vitals:   06/08/22 1355  BP: 120/70  Pulse: 79  Resp: 18  SpO2: 98%  Weight: 224 lb 3.2 oz (101.7 kg)  Height: '5\' 8"'$  (1.727 m)    Assessment & Plan:

## 2022-06-10 NOTE — Assessment & Plan Note (Signed)
Checking CMP but no action needed without significant change.

## 2022-06-10 NOTE — Assessment & Plan Note (Signed)
He has been working on weight loss and recently went on vacation which is why weight is not down from last year.

## 2022-06-10 NOTE — Assessment & Plan Note (Signed)
Checking lipid panel and adjust as needed. Not on medication currently.

## 2022-06-10 NOTE — Assessment & Plan Note (Signed)
Flu shot yearly. Covid-19 counseled. Shingrix counseled defers today. Tetanus counseled defers today. Colonoscopy up to date. Counseled about sun safety and mole surveillance. Counseled about the dangers of distracted driving. Given 10 year screening recommendations.

## 2023-06-28 ENCOUNTER — Encounter: Payer: Self-pay | Admitting: Internal Medicine

## 2023-06-28 ENCOUNTER — Ambulatory Visit (INDEPENDENT_AMBULATORY_CARE_PROVIDER_SITE_OTHER): Payer: Managed Care, Other (non HMO) | Admitting: Internal Medicine

## 2023-06-28 VITALS — BP 120/100 | HR 62 | Temp 98.2°F | Ht 68.0 in | Wt 224.0 lb

## 2023-06-28 DIAGNOSIS — E782 Mixed hyperlipidemia: Secondary | ICD-10-CM

## 2023-06-28 DIAGNOSIS — Z0000 Encounter for general adult medical examination without abnormal findings: Secondary | ICD-10-CM | POA: Diagnosis not present

## 2023-06-28 DIAGNOSIS — E669 Obesity, unspecified: Secondary | ICD-10-CM | POA: Diagnosis not present

## 2023-06-28 DIAGNOSIS — R030 Elevated blood-pressure reading, without diagnosis of hypertension: Secondary | ICD-10-CM

## 2023-06-28 LAB — COMPREHENSIVE METABOLIC PANEL
ALT: 37 U/L (ref 0–53)
AST: 24 U/L (ref 0–37)
Albumin: 4.7 g/dL (ref 3.5–5.2)
Alkaline Phosphatase: 53 U/L (ref 39–117)
BUN: 15 mg/dL (ref 6–23)
CO2: 27 mEq/L (ref 19–32)
Calcium: 9.9 mg/dL (ref 8.4–10.5)
Chloride: 104 mEq/L (ref 96–112)
Creatinine, Ser: 0.87 mg/dL (ref 0.40–1.50)
GFR: 100.16 mL/min (ref >60.00)
Glucose, Bld: 98 mg/dL (ref 70–99)
Potassium: 4 mEq/L (ref 3.5–5.1)
Sodium: 139 mEq/L (ref 135–145)
Total Bilirubin: 0.9 mg/dL (ref 0.2–1.2)
Total Protein: 7.5 g/dL (ref 6.0–8.3)

## 2023-06-28 LAB — LIPID PANEL
Cholesterol: 203 mg/dL — ABNORMAL HIGH (ref 0–200)
HDL: 53.2 mg/dL (ref >39.00)
LDL Cholesterol: 126 mg/dL — ABNORMAL HIGH (ref 0–99)
NonHDL: 150.22
Total CHOL/HDL Ratio: 4
Triglycerides: 123 mg/dL (ref 0.0–149.0)
VLDL: 24.6 mg/dL (ref 0.0–40.0)

## 2023-06-28 LAB — CBC
HCT: 43 % (ref 39.0–52.0)
Hemoglobin: 14.7 g/dL (ref 13.0–17.0)
MCHC: 34.3 g/dL (ref 30.0–36.0)
MCV: 92.3 fl (ref 78.0–100.0)
Platelets: 221 10*3/uL (ref 150.0–400.0)
RBC: 4.66 Mil/uL (ref 4.22–5.81)
RDW: 12.8 % (ref 11.5–15.5)
WBC: 6.2 10*3/uL (ref 4.0–10.5)

## 2023-06-28 NOTE — Progress Notes (Signed)
   Subjective:   Patient ID: Travis Forbes, male    DOB: 03/29/1971, 52 y.o.   MRN: [id number]  HPI The patient is here for physical.  PMH, [facility], social history reviewed and updated  Review of Systems  Constitutional: Negative.   HENT: Negative.    Eyes: Negative.   Respiratory:  Negative for cough, chest tightness and shortness of breath.   Cardiovascular:  Negative for chest pain, palpitations and leg swelling.  Gastrointestinal:  Negative for abdominal distention, abdominal pain, constipation, diarrhea, nausea and vomiting.  Musculoskeletal: Negative.   Skin: Negative.   Neurological: Negative.   Psychiatric/Behavioral: Negative.      Objective:  Physical Exam Constitutional:      Appearance: He is well-developed.  HENT:     Head: Normocephalic and atraumatic.  Cardiovascular:     Rate and Rhythm: Normal rate and regular rhythm.  Pulmonary:     Effort: Pulmonary effort is normal. No respiratory distress.     Breath sounds: Normal breath sounds. No wheezing or rales.  Abdominal:     General: Bowel sounds are normal. There is no distension.     Palpations: Abdomen is soft.     Tenderness: There is no abdominal tenderness. There is no rebound.  Musculoskeletal:     Cervical back: Normal range of motion.  Skin:    General: Skin is warm and dry.  Neurological:     Mental Status: He is alert and oriented to person, place, and time.     Coordination: Coordination normal.     Vitals:   06/28/23 0807 06/28/23 0809 06/28/23 0831  BP: (!) 138/100 (!) 138/100 (!) 120/100  Pulse: 62    Temp: 98.2 F (36.8 C)    TempSrc: Oral    SpO2: 98%    Weight: 224 lb (101.6 kg)    Height: 5\' 8"  (1.727 m)      Assessment & Plan:

## 2023-07-02 DIAGNOSIS — R030 Elevated blood-pressure reading, without diagnosis of hypertension: Secondary | ICD-10-CM | POA: Insufficient documentation

## 2023-07-02 NOTE — Assessment & Plan Note (Signed)
Weight stable overall and counseled about diet and exercise.

## 2023-07-02 NOTE — Assessment & Plan Note (Signed)
Checking lipid panel and adjust as needed. Not on medication currently.

## 2023-07-02 NOTE — Assessment & Plan Note (Signed)
No prior elevated readings. He has monitor at home and will monitor and report readings to assess baseline levels.

## 2023-07-02 NOTE — Assessment & Plan Note (Signed)
Flu shot yearly. Shingrix declines today. Tetanus declines today. Colonoscopy up to date. Counseled about sun safety and mole surveillance. Counseled about the dangers of distracted driving. Given 10 year screening recommendations.

## 2023-07-05 ENCOUNTER — Telehealth: Payer: Self-pay | Admitting: Internal Medicine

## 2023-07-05 NOTE — Telephone Encounter (Signed)
Patient returned Travis Forbes's call about his lab results. He would like a call back at [phone].

## 2023-07-07 NOTE — Telephone Encounter (Signed)
Patient was able to review there results and there were no questions or concerns

## 2024-07-03 ENCOUNTER — Encounter: Payer: Managed Care, Other (non HMO) | Admitting: Internal Medicine

## 2024-07-10 ENCOUNTER — Ambulatory Visit (INDEPENDENT_AMBULATORY_CARE_PROVIDER_SITE_OTHER): Admitting: Internal Medicine

## 2024-07-10 ENCOUNTER — Encounter: Payer: Self-pay | Admitting: Internal Medicine

## 2024-07-10 ENCOUNTER — Ambulatory Visit: Payer: Self-pay | Admitting: Internal Medicine

## 2024-07-10 VITALS — BP 132/86 | HR 65 | Temp 98.4°F | Ht 68.0 in | Wt 227.1 lb

## 2024-07-10 DIAGNOSIS — Z0000 Encounter for general adult medical examination without abnormal findings: Secondary | ICD-10-CM

## 2024-07-10 DIAGNOSIS — E782 Mixed hyperlipidemia: Secondary | ICD-10-CM

## 2024-07-10 DIAGNOSIS — R030 Elevated blood-pressure reading, without diagnosis of hypertension: Secondary | ICD-10-CM

## 2024-07-10 LAB — CBC
HCT: 42 % (ref 39.0–52.0)
Hemoglobin: 14.3 g/dL (ref 13.0–17.0)
MCHC: 34.1 g/dL (ref 30.0–36.0)
MCV: 92.3 fl (ref 78.0–100.0)
Platelets: 212 K/uL (ref 150.0–400.0)
RBC: 4.55 Mil/uL (ref 4.22–5.81)
RDW: 12.2 % (ref 11.5–15.5)
WBC: 6.1 K/uL (ref 4.0–10.5)

## 2024-07-10 LAB — COMPREHENSIVE METABOLIC PANEL WITH GFR
ALT: 34 U/L (ref 0–53)
AST: 22 U/L (ref 0–37)
Albumin: 4.7 g/dL (ref 3.5–5.2)
Alkaline Phosphatase: 53 U/L (ref 39–117)
BUN: 14 mg/dL (ref 6–23)
CO2: 28 meq/L (ref 19–32)
Calcium: 9.6 mg/dL (ref 8.4–10.5)
Chloride: 103 meq/L (ref 96–112)
Creatinine, Ser: 0.78 mg/dL (ref 0.40–1.50)
GFR: 102.77 mL/min (ref >60.00)
Glucose, Bld: 92 mg/dL (ref 70–99)
Potassium: 4 meq/L (ref 3.5–5.1)
Sodium: 140 meq/L (ref 135–145)
Total Bilirubin: 1.3 mg/dL — ABNORMAL HIGH (ref 0.2–1.2)
Total Protein: 7.2 g/dL (ref 6.0–8.3)

## 2024-07-10 LAB — LIPID PANEL
Cholesterol: 206 mg/dL — ABNORMAL HIGH (ref 0–200)
HDL: 51.9 mg/dL (ref >39.00)
LDL Cholesterol: 128 mg/dL — ABNORMAL HIGH (ref 0–99)
NonHDL: 154.59
Total CHOL/HDL Ratio: 4
Triglycerides: 135 mg/dL (ref 0.0–149.0)
VLDL: 27 mg/dL (ref 0.0–40.0)

## 2024-07-10 NOTE — Progress Notes (Signed)
   Subjective:   Patient ID: Travis Forbes, male    DOB: 09-08-1971, 53 y.o.   MRN: [id number]  HPI The patient is here for physical.  PMH, [facility], social history reviewed and updated  Review of Systems  Constitutional: Negative.   HENT: Negative.    Eyes: Negative.   Respiratory:  Negative for cough, chest tightness and shortness of breath.   Cardiovascular:  Negative for chest pain, palpitations and leg swelling.  Gastrointestinal:  Negative for abdominal distention, abdominal pain, constipation, diarrhea, nausea and vomiting.  Musculoskeletal: Negative.   Skin: Negative.   Neurological: Negative.   Psychiatric/Behavioral: Negative.      Objective:  Physical Exam Constitutional:      Appearance: He is well-developed.  HENT:     Head: Normocephalic and atraumatic.  Cardiovascular:     Rate and Rhythm: Normal rate and regular rhythm.  Pulmonary:     Effort: Pulmonary effort is normal. No respiratory distress.     Breath sounds: Normal breath sounds. No wheezing or rales.  Abdominal:     General: Bowel sounds are normal. There is no distension.     Palpations: Abdomen is soft.     Tenderness: There is no abdominal tenderness. There is no rebound.  Musculoskeletal:     Cervical back: Normal range of motion.  Skin:    General: Skin is warm and dry.  Neurological:     Mental Status: He is alert and oriented to person, place, and time.     Coordination: Coordination normal.     Vitals:   07/10/24 0927 07/10/24 0943  BP: (!) 140/86 132/86  Pulse: 65   Temp: 98.4 F (36.9 C)   TempSrc: Temporal   SpO2: 98%   Weight: 227 lb 2 oz (103 kg)   Height: 5' 8 (1.727 m)     Assessment & Plan:

## 2024-07-10 NOTE — Assessment & Plan Note (Signed)
 BP normal on recheck and normal at home. Continue monitoring.

## 2024-07-10 NOTE — Assessment & Plan Note (Signed)
 Checking cMP for stability.

## 2024-07-10 NOTE — Assessment & Plan Note (Signed)
Checking lipid panel and adjust as needed. Controlled with diet.

## 2024-07-10 NOTE — Assessment & Plan Note (Signed)
 Flu shot yearly. Pneumonia complete. Shingrix done per patient. Tetanus due. Colonoscopy up to date. Counseled about sun safety and mole surveillance. Counseled about the dangers of distracted driving. Given 10 year screening recommendations.
